# Patient Record
Sex: Male | Born: 1975 | Hispanic: No | Marital: Single | Smoking: Current every day smoker
Health system: Southern US, Community
[De-identification: ages and names within clinical notes are randomized; demographics above are authoritative.]

## PROBLEM LIST (undated history)

## (undated) DIAGNOSIS — I10 Essential (primary) hypertension: Secondary | ICD-10-CM

## (undated) DIAGNOSIS — E669 Obesity, unspecified: Secondary | ICD-10-CM

## (undated) HISTORY — PX: HERNIA REPAIR: SHX51

## (undated) HISTORY — DX: Obesity, unspecified: E66.9

---

## 2010-01-13 ENCOUNTER — Encounter (INDEPENDENT_AMBULATORY_CARE_PROVIDER_SITE_OTHER): Payer: Self-pay | Admitting: *Deleted

## 2010-03-29 NOTE — Letter (Signed)
Summary: [place] No Show Letter  [place] [place]  [address] [place], [place] [address]   Phone: [phone]  Fax: [phone]    01/13/2010 MRN: [id number]  [facility] [address] [place], [place]  [address]   Dear Mr. Sheckler,   Our records indicate that you missed your scheduled appointment with _____Dr.Tabori___________ on _____11/17/11_____.  Please contact this office to reschedule your appointment as soon as possible.  It is important that you keep your scheduled appointments with your physician, so we can provide you the best care possible.  Please be advised that there may be a charge for "no show" appointments.    Sincerely,   [place] [facility]

## 2010-06-30 ENCOUNTER — Encounter: Payer: Self-pay | Admitting: Family Medicine

## 2010-06-30 ENCOUNTER — Encounter: Payer: Self-pay | Admitting: *Deleted

## 2010-06-30 ENCOUNTER — Ambulatory Visit (INDEPENDENT_AMBULATORY_CARE_PROVIDER_SITE_OTHER): Payer: 59 | Admitting: Family Medicine

## 2010-06-30 DIAGNOSIS — Z720 Tobacco use: Secondary | ICD-10-CM | POA: Insufficient documentation

## 2010-06-30 DIAGNOSIS — Z0000 Encounter for general adult medical examination without abnormal findings: Secondary | ICD-10-CM

## 2010-06-30 DIAGNOSIS — F17200 Nicotine dependence, unspecified, uncomplicated: Secondary | ICD-10-CM

## 2010-06-30 LAB — HEPATIC FUNCTION PANEL
AST: 23 U/L (ref 0–37)
Albumin: 4.2 g/dL (ref 3.5–5.2)
Alkaline Phosphatase: 47 U/L (ref 39–117)
Bilirubin, Direct: 0.2 mg/dL (ref 0.0–0.3)
Total Bilirubin: 1.1 mg/dL (ref 0.3–1.2)

## 2010-06-30 LAB — CBC WITH DIFFERENTIAL/PLATELET
Basophils Absolute: 0 10*3/uL (ref 0.0–0.1)
Eosinophils Absolute: 0.1 10*3/uL (ref 0.0–0.7)
HCT: 44.5 % (ref 39.0–52.0)
Lymphs Abs: 1.7 10*3/uL (ref 0.7–4.0)
MCHC: 34.6 g/dL (ref 30.0–36.0)
MCV: 90 fl (ref 78.0–100.0)
Monocytes Absolute: 0.4 10*3/uL (ref 0.1–1.0)
Neutrophils Relative %: 55.4 % (ref 43.0–77.0)
Platelets: 216 10*3/uL (ref 150.0–400.0)
RDW: 13.2 % (ref 11.5–14.6)
WBC: 4.9 10*3/uL (ref 4.5–10.5)

## 2010-06-30 LAB — LIPID PANEL
HDL: 45.2 mg/dL (ref >39.00)
Total CHOL/HDL Ratio: 3
Triglycerides: 62 mg/dL (ref 0.0–149.0)
VLDL: 12.4 mg/dL (ref 0.0–40.0)

## 2010-06-30 LAB — BASIC METABOLIC PANEL
Calcium: 9.2 mg/dL (ref 8.4–10.5)
GFR: 107.28 mL/min (ref >60.00)
Glucose, Bld: 88 mg/dL (ref 70–99)
Potassium: 4.1 mEq/L (ref 3.5–5.1)
Sodium: 141 mEq/L (ref 135–145)

## 2010-06-30 MED ORDER — VARENICLINE TARTRATE 0.5 MG X 11 & 1 MG X 42 PO MISC: ORAL | Status: DC

## 2010-06-30 NOTE — Patient Instructions (Signed)
Follow up in 1 month to check on smoking and Chantix Start Chantix 1 week prior to your quit date Keep up the good work on exercise and try and make healthy food choices Call with any questions or concerns You can quit!!!

## 2010-06-30 NOTE — Assessment & Plan Note (Signed)
Pt's PE WNL w/ exception of obesity.  BP mildly elevated but reports this is frequent at doctor's appts but BP then normal when checked at pharmacy.  Will follow.  Anticipatory guidance provided.  Check labs.

## 2010-06-30 NOTE — Progress Notes (Signed)
  Subjective:    Patient ID: Paul Young, male    DOB: 11-08-1975, 35 y.o.   MRN: [id number]  HPI Here today to establish care.  Desires CPE.  Tobacco use- has been smoking since age 55.  Ready to quit.  Smoking 1 ppd or less.  No personal hx of mental illness- depression/anxiety.   Review of Systems Patient reports no vision/hearing changes, anorexia, fever ,adenopathy, persistant/recurrent hoarseness, swallowing issues, chest pain, palpitations, edema, persistant/recurrent cough, hemoptysis, dyspnea (rest,exertional, paroxysmal nocturnal), gastrointestinal  bleeding (melena, rectal bleeding), abdominal pain, excessive heart burn, GU symptoms (dysuria, hematuria, voiding/incontinence issues) syncope, focal weakness, memory loss, numbness & tingling, skin/hair/nail changes, depression, anxiety, abnormal bruising/bleeding, musculoskeletal symptoms/signs.    Objective:   Physical Exam BP 140/90  Temp(Src) 98.8 F (37.1 C) (Oral)  Ht 6\' 1"  (1.854 m)  Wt 262 lb 8 oz (119.069 kg)  BMI 34.63 kg/m2  General Appearance:    Alert, cooperative, no distress, appears stated age  Head:    Normocephalic, without obvious abnormality, atraumatic  Eyes:    PERRL, conjunctiva/corneas clear, EOM's intact, fundi    benign, both eyes       Ears:    Normal TM's and external ear canals, both ears  Nose:   Nares normal, septum midline, mucosa normal, no drainage   or sinus tenderness  Throat:   Lips, mucosa, and tongue normal; teeth and gums normal  Neck:   Supple, symmetrical, trachea midline, no adenopathy;       thyroid:  No enlargement/tenderness/nodules  Back:     Symmetric, no curvature, ROM normal, no CVA tenderness  Lungs:     Clear to auscultation bilaterally, respirations unlabored  Chest wall:    No tenderness or deformity  Heart:    Regular rate and rhythm, S1 and S2 normal, no murmur, rub   or gallop  Abdomen:     Soft, non-tender, bowel sounds active all four quadrants,    no masses,  no organomegaly  Genitalia:    Normal male without lesion, mass, discharge or tenderness  Rectal:    Deferred  Extremities:   Extremities normal, atraumatic, no cyanosis or edema  Pulses:   2+ and symmetric all extremities  Skin:   Skin color, texture, turgor normal, no rashes or lesions  Lymph nodes:   Cervical, supraclavicular, and axillary nodes normal  Neurologic:   CNII-XII intact. Normal strength, sensation and reflexes      throughout          Assessment & Plan:

## 2010-06-30 NOTE — Assessment & Plan Note (Signed)
Pt committed to quitting.  Start Chantix.  Follow closely.

## 2010-11-11 ENCOUNTER — Emergency Department (HOSPITAL_BASED_OUTPATIENT_CLINIC_OR_DEPARTMENT_OTHER)
Admission: EM | Admit: 2010-11-11 | Discharge: 2010-11-11 | Discharge status: Against Medical Advice | Payer: 59 | Attending: Emergency Medicine | Admitting: Emergency Medicine

## 2010-11-11 ENCOUNTER — Encounter (HOSPITAL_BASED_OUTPATIENT_CLINIC_OR_DEPARTMENT_OTHER): Payer: Self-pay | Admitting: *Deleted

## 2010-11-11 DIAGNOSIS — M549 Dorsalgia, unspecified: Secondary | ICD-10-CM | POA: Insufficient documentation

## 2010-11-11 NOTE — ED Notes (Signed)
Pt states he will not be able to wait and see the doctor due to fact it is a long wait and he has to work tomorrow.

## 2010-11-11 NOTE — ED Notes (Signed)
Lower back pain. No known injury. Pain radiates into his left leg.

## 2011-01-26 ENCOUNTER — Encounter: Payer: Self-pay | Admitting: Family Medicine

## 2011-01-26 ENCOUNTER — Ambulatory Visit (INDEPENDENT_AMBULATORY_CARE_PROVIDER_SITE_OTHER): Payer: 59 | Admitting: Family Medicine

## 2011-01-26 DIAGNOSIS — D239 Other benign neoplasm of skin, unspecified: Secondary | ICD-10-CM

## 2011-01-26 DIAGNOSIS — N529 Male erectile dysfunction, unspecified: Secondary | ICD-10-CM

## 2011-01-26 DIAGNOSIS — Z20828 Contact with and (suspected) exposure to other viral communicable diseases: Secondary | ICD-10-CM

## 2011-01-26 DIAGNOSIS — F17200 Nicotine dependence, unspecified, uncomplicated: Secondary | ICD-10-CM

## 2011-01-26 DIAGNOSIS — Z720 Tobacco use: Secondary | ICD-10-CM

## 2011-01-26 MED ORDER — NICOTINE 10 MG IN INHA: 1.0000 | RESPIRATORY_TRACT | Status: AC | PRN

## 2011-01-26 NOTE — Patient Instructions (Signed)
Follow up in 1-2 months to follow up smoking cessation We'll notify you of your lab results Use the nicotine inhaler to quit smoking- no more than 10 puffs daily Break the Cialis in half and take 30 minutes prior to activity (it lasts 36 hrs) Call with any questions or concerns Hang in there! Happy Holidays!

## 2011-01-26 NOTE — Progress Notes (Signed)
  Subjective:    Patient ID: Paul Young, male    DOB: March 14, 1975, 35 y.o.   MRN: [id number]  HPI Bump- on L lower leg, been there for possibly years.  No pain, no drainage but won't go away.  Possible exposure to STDs- wife had affair, wants checked.  Tobacco use- reports Chantix did 'nothing'.  Wants to try nicotine inhaler  ED- no problem getting erection, problem maintaining erections.  sxs started 1 yr ago.  Denies increased fatigue.   Review of Systems For ROS see HPI     Objective:   Physical Exam  Constitutional: He appears well-developed and well-nourished. No distress.  Neck: Normal range of motion. Neck supple. No thyromegaly present.  Cardiovascular: Normal rate, regular rhythm, normal heart sounds and intact distal pulses.   No murmur heard. Pulmonary/Chest: Effort normal and breath sounds normal. No respiratory distress. He has no wheezes. He has no rales.  Skin: Skin is warm and dry.       ~1cm dermatofibroma on L lower leg  Psychiatric: He has a normal mood and affect. His behavior is normal. Judgment and thought content normal.          Assessment & Plan:

## 2011-01-27 LAB — GC/CHLAMYDIA PROBE AMP, URINE: GC Probe Amp, Urine: NEGATIVE

## 2011-01-27 LAB — TESTOSTERONE, FREE, TOTAL, SHBG
Testosterone, Free: 180.6 pg/mL (ref 47.0–244.0)
Testosterone-% Free: 2 % (ref 1.6–2.9)

## 2011-01-27 LAB — HIV ANTIBODY (ROUTINE TESTING W REFLEX): HIV: NONREACTIVE

## 2011-02-05 NOTE — Assessment & Plan Note (Signed)
Due to wife's recent affair will get STD tests.

## 2011-02-05 NOTE — Assessment & Plan Note (Addendum)
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  Reassurance provided.  Pt would like this removed eventually but 'not right now'.  Will continue to follow.

## 2011-02-05 NOTE — Assessment & Plan Note (Signed)
Since chantix was unsuccessful will start nicotrol inhaler.  Instructions on use provided.  Will follow.

## 2011-02-05 NOTE — Assessment & Plan Note (Signed)
Discussed that this may be psychological w/ his recent family stressors.  Will start cialis and see if this improves his current sxs.  Check testosterone to assess for low t.  Pt expressed understanding and is in agreement w/ plan.

## 2011-04-24 ENCOUNTER — Telehealth: Payer: Self-pay | Admitting: Family Medicine

## 2011-04-24 NOTE — Telephone Encounter (Signed)
Patient was seen in November and given samples of Cialis. Patient would like a prescription called into the pharmacy. [facility] [address].

## 2011-04-25 MED ORDER — TADALAFIL 20 MG PO TABS: ORAL_TABLET | ORAL | Status: DC

## 2011-04-25 NOTE — Telephone Encounter (Signed)
Pt had 20mg  dose for PRN use- he was to break it in half.  #10, 3 refills

## 2011-04-25 NOTE — Telephone Encounter (Signed)
Last OV 01-26-11 not sure of correct dosage for pt per not in chart on med list please advise

## 2011-04-25 NOTE — Telephone Encounter (Signed)
rx sent to pharmacy by e-script  

## 2011-06-22 ENCOUNTER — Encounter: Payer: Self-pay | Admitting: Family Medicine

## 2011-06-22 ENCOUNTER — Ambulatory Visit (INDEPENDENT_AMBULATORY_CARE_PROVIDER_SITE_OTHER): Payer: 59 | Admitting: Family Medicine

## 2011-06-22 VITALS — BP 130/82 | HR 74 | Temp 97.8°F | Ht 69.75 in | Wt 235.4 lb

## 2011-06-22 DIAGNOSIS — F329 Major depressive disorder, single episode, unspecified: Secondary | ICD-10-CM | POA: Insufficient documentation

## 2011-06-22 DIAGNOSIS — F341 Dysthymic disorder: Secondary | ICD-10-CM

## 2011-06-22 DIAGNOSIS — L738 Other specified follicular disorders: Secondary | ICD-10-CM

## 2011-06-22 DIAGNOSIS — B350 Tinea barbae and tinea capitis: Secondary | ICD-10-CM

## 2011-06-22 DIAGNOSIS — F32A Depression, unspecified: Secondary | ICD-10-CM | POA: Insufficient documentation

## 2011-06-22 DIAGNOSIS — E669 Obesity, unspecified: Secondary | ICD-10-CM

## 2011-06-22 DIAGNOSIS — F419 Anxiety disorder, unspecified: Secondary | ICD-10-CM

## 2011-06-22 MED ORDER — TRIAMCINOLONE ACETONIDE 0.1 % EX OINT: TOPICAL_OINTMENT | Freq: Two times a day (BID) | CUTANEOUS | Status: DC

## 2011-06-22 MED ORDER — ESCITALOPRAM OXALATE 10 MG PO TABS: 10.0000 mg | ORAL_TABLET | Freq: Every day | ORAL | Status: DC

## 2011-06-22 NOTE — Assessment & Plan Note (Signed)
New.  Pt continues to lose weight.  Now exercising regularly and eating better.  Applauded his efforts.  Will continue to follow.

## 2011-06-22 NOTE — Assessment & Plan Note (Signed)
New.  Pt is going through difficulty divorce, financial issues and work stress.  Trouble w/ sleep.  No anger issues or SI/HI.  Start Lexapro daily.  Will follow closely.

## 2011-06-22 NOTE — Patient Instructions (Signed)
Follow up in 1 month to recheck mood Start the Lexapro daily Apply the triamcinolone twice daily as needed for the rash Don't shave over the area that's inflamed, this will make it worse Call with any questions or concerns Hang in there!!!

## 2011-06-22 NOTE — Assessment & Plan Note (Signed)
New.  Pt's rash consistent w/ shaving.  Start Triamcinolone cream prn for itching and redness.  Reviewed supportive care and red flags that should prompt return.  Pt expressed understanding and is in agreement w/ plan.
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
## 2011-07-20 ENCOUNTER — Ambulatory Visit: Payer: 59 | Admitting: Family Medicine

## 2011-07-20 DIAGNOSIS — Z0289 Encounter for other administrative examinations: Secondary | ICD-10-CM

## 2011-08-23 ENCOUNTER — Emergency Department (HOSPITAL_COMMUNITY)
Admission: EM | Admit: 2011-08-23 | Discharge: 2011-08-23 | Disposition: A | Discharge status: Home / Self Care | Payer: 59 | Attending: Emergency Medicine | Admitting: Emergency Medicine

## 2011-08-23 ENCOUNTER — Emergency Department (HOSPITAL_COMMUNITY): Payer: 59

## 2011-08-23 ENCOUNTER — Encounter (HOSPITAL_COMMUNITY): Payer: Self-pay | Admitting: Family Medicine

## 2011-08-23 DIAGNOSIS — R5381 Other malaise: Secondary | ICD-10-CM | POA: Insufficient documentation

## 2011-08-23 DIAGNOSIS — R5383 Other fatigue: Secondary | ICD-10-CM | POA: Insufficient documentation

## 2011-08-23 DIAGNOSIS — R531 Weakness: Secondary | ICD-10-CM

## 2011-08-23 DIAGNOSIS — R030 Elevated blood-pressure reading, without diagnosis of hypertension: Secondary | ICD-10-CM | POA: Insufficient documentation

## 2011-08-23 DIAGNOSIS — F17200 Nicotine dependence, unspecified, uncomplicated: Secondary | ICD-10-CM | POA: Insufficient documentation

## 2011-08-23 LAB — CBC WITH DIFFERENTIAL/PLATELET
Basophils Absolute: 0 10*3/uL (ref 0.0–0.1)
Basophils Relative: 0 % (ref 0–1)
Eosinophils Absolute: 0 10*3/uL (ref 0.0–0.7)
Eosinophils Relative: 1 % (ref 0–5)
HCT: 45.1 % (ref 39.0–52.0)
MCH: 31.7 pg (ref 26.0–34.0)
MCHC: 35.5 g/dL (ref 30.0–36.0)
MCV: 89.5 fL (ref 78.0–100.0)
Monocytes Absolute: 0.5 10*3/uL (ref 0.1–1.0)
RDW: 12.9 % (ref 11.5–15.5)

## 2011-08-23 LAB — BASIC METABOLIC PANEL
CO2: 26 mEq/L (ref 19–32)
Calcium: 9.8 mg/dL (ref 8.4–10.5)
Creatinine, Ser: 0.88 mg/dL (ref 0.50–1.35)
GFR calc Af Amer: >90 mL/min (ref >90)
GFR calc non Af Amer: >90 mL/min (ref >90)

## 2011-08-23 LAB — GLUCOSE, CAPILLARY: Glucose-Capillary: 143 mg/dL — ABNORMAL HIGH (ref 70–99)

## 2011-08-23 MED ORDER — ASPIRIN 81 MG PO CHEW: 324.0000 mg | CHEWABLE_TABLET | Freq: Once | ORAL | Status: DC

## 2011-08-23 MED ORDER — LISINOPRIL 20 MG PO TABS: 10.0000 mg | ORAL_TABLET | Freq: Every day | ORAL | Status: DC

## 2011-08-23 NOTE — Discharge Instructions (Signed)
Fatigue Fatigue is a feeling of tiredness, lack of energy, lack of motivation, or feeling tired all the time. Having enough rest, good nutrition, and reducing stress will normally reduce fatigue. Consult your caregiver if it persists. The nature of your fatigue will help your caregiver to find out its cause. The treatment is based on the cause.  CAUSES  There are many causes for fatigue. Most of the time, fatigue can be traced to one or more of your habits or routines. Most causes fit into one or more of three general areas. They are: Lifestyle problems  Sleep disturbances.   Overwork.   Physical exertion.   Unhealthy habits.   Poor eating habits or eating disorders.   Alcohol and/or drug use .   Lack of proper nutrition (malnutrition).  Psychological problems  Stress and/or anxiety problems.   Depression.   Grief.   Boredom.  Medical Problems or Conditions  Anemia.   Pregnancy.   Thyroid gland problems.   Recovery from major surgery.   Continuous pain.   Emphysema or asthma that is not well controlled   Allergic conditions.   Diabetes.   Infections (such as mononucleosis).   Obesity.   Sleep disorders, such as sleep apnea.   Heart failure or other heart-related problems.   Cancer.   Kidney disease.   Liver disease.   Effects of certain medicines such as antihistamines, cough and cold remedies, prescription pain medicines, heart and blood pressure medicines, drugs used for treatment of cancer, and some antidepressants.  SYMPTOMS  The symptoms of fatigue include:   Lack of energy.   Lack of drive (motivation).   Drowsiness.   Feeling of indifference to the surroundings.  DIAGNOSIS  The details of how you feel help guide your caregiver in finding out what is causing the fatigue. You will be asked about your present and past health condition. It is important to review all medicines that you take, including prescription and non-prescription items. A  thorough exam will be done. You will be questioned about your feelings, habits, and normal lifestyle. Your caregiver may suggest blood tests, urine tests, or other tests to look for common medical causes of fatigue.  TREATMENT  Fatigue is treated by correcting the underlying cause. For example, if you have continuous pain or depression, treating these causes will improve how you feel. Similarly, adjusting the dose of certain medicines will help in reducing fatigue.  HOME CARE INSTRUCTIONS   Try to get the required amount of good sleep every night.   Eat a healthy and nutritious diet, and drink enough water throughout the day.   Practice ways of relaxing (including yoga or meditation).   Exercise regularly.   Make plans to change situations that cause stress. Act on those plans so that stresses decrease over time. Keep your work and personal routine reasonable.   Avoid street drugs and minimize use of alcohol.   Start taking a daily multivitamin after consulting your caregiver.  SEEK MEDICAL CARE IF:   You have persistent tiredness, which cannot be accounted for.   You have fever.   You have unintentional weight loss.   You have headaches.   You have disturbed sleep throughout the night.   You are feeling sad.   You have constipation.   You have dry skin.   You have gained weight.   You are taking any new or different medicines that you suspect are causing fatigue.   You are unable to sleep at night.     You develop any unusual swelling of your legs or other parts of your body.  SEEK IMMEDIATE MEDICAL CARE IF:   You are feeling confused.   Your vision is blurred.   You feel faint or pass out.   You develop severe headache.   You develop severe abdominal, pelvic, or back pain.   You develop chest pain, shortness of breath, or an irregular or fast heartbeat.   You are unable to pass a normal amount of urine.   You develop abnormal bleeding such as bleeding from  the rectum or you vomit blood.   You have thoughts about harming yourself or committing suicide.   You are worried that you might harm someone else.  MAKE SURE YOU:   Understand these instructions.   Will watch your condition.   Will get help right away if you are not doing well or get worse.  Document Released: 12/11/2006 Document Revised: 02/02/2011 Document Reviewed: 12/11/2006 [facility] Patient Information 2012 [place], [place].Arterial Hypertension Arterial hypertension (high blood pressure) is a condition of elevated pressure in your blood vessels. Hypertension over a long period of time is a risk factor for strokes, heart attacks, and heart failure. It is also the leading cause of kidney (renal) failure.  CAUSES   In Adults -- Over 90% of all hypertension has no known cause. This is called essential or primary hypertension. In the other 10% of people with hypertension, the increase in blood pressure is caused by another disorder. This is called secondary hypertension. Important causes of secondary hypertension are:   Heavy alcohol use.   Obstructive sleep apnea.   Hyperaldosterosim (Conn's syndrome).   Steroid use.   Chronic kidney failure.   Hyperparathyroidism.   Medications.   Renal artery stenosis.   Pheochromocytoma.   Cushing's disease.   Coarctation of the aorta.   Scleroderma renal crisis.   Licorice (in excessive amounts).   Drugs (cocaine, methamphetamine).  Your caregiver can explain any items above that apply to you.  In Children -- Secondary hypertension is more common and should always be considered.   Pregnancy -- Few women of childbearing age have high blood pressure. However, up to 10% of them develop hypertension of pregnancy. Generally, this will not harm the woman. It may be a sign of 3 complications of pregnancy: preeclampsia, HELLP syndrome, and eclampsia. Follow up and control with medication is necessary.  SYMPTOMS   This condition  normally does not produce any noticeable symptoms. It is usually found during a routine exam.   Malignant hypertension is a late problem of high blood pressure. It may have the following symptoms:   Headaches.   Blurred vision.   End-organ damage (this means your kidneys, heart, lungs, and other organs are being damaged).   Stressful situations can increase the blood pressure. If a person with normal blood pressure has their blood pressure go up while being seen by their caregiver, this is often termed "white coat hypertension." Its importance is not known. It may be related with eventually developing hypertension or complications of hypertension.   Hypertension is often confused with mental tension, stress, and anxiety.  DIAGNOSIS  The diagnosis is made by 3 separate blood pressure measurements. They are taken at least 1 week apart from each other. If there is organ damage from hypertension, the diagnosis may be made without repeat measurements. Hypertension is usually identified by having blood pressure readings:  Above 140/90 mmHg measured in both arms, at 3 separate times, over a couple weeks.  Over 130/80 mmHg should be considered a risk factor and may require treatment in patients with diabetes.  Blood pressure readings over 120/80 mmHg are called "pre-hypertension" even in non-diabetic patients. To get a true blood pressure measurement, use the following guidelines. Be aware of the factors that can alter blood pressure readings.  Take measurements at least 1 hour after caffeine.   Take measurements 30 minutes after smoking and without any stress. This is another reason to quit smoking - it raises your blood pressure.   Use a proper cuff size. Ask your caregiver if you are not sure about your cuff size.   Most home blood pressure cuffs are automatic. They will measure systolic and diastolic pressures. The systolic pressure is the pressure reading at the start of sounds. Diastolic  pressure is the pressure at which the sounds disappear. If you are elderly, measure pressures in multiple postures. Try sitting, lying or standing.   Sit at rest for a minimum of 5 minutes before taking measurements.   You should not be on any medications like decongestants. These are found in many cold medications.   Record your blood pressure readings and review them with your caregiver.  If you have hypertension:  Your caregiver may do tests to be sure you do not have secondary hypertension (see "causes" above).   Your caregiver may also look for signs of metabolic syndrome. This is also called Syndrome X or Insulin Resistance Syndrome. You may have this syndrome if you have type 2 diabetes, abdominal obesity, and abnormal blood lipids in addition to hypertension.   Your caregiver will take your medical and family history and perform a physical exam.   Diagnostic tests may include blood tests (for glucose, cholesterol, potassium, and kidney function), a urinalysis, or an EKG. Other tests may also be necessary depending on your condition.  PREVENTION  There are important lifestyle issues that you can adopt to reduce your chance of developing hypertension:  Maintain a normal weight.   Limit the amount of salt (sodium) in your diet.   Exercise often.   Limit alcohol intake.   Get enough potassium in your diet. Discuss specific advice with your caregiver.   Follow a DASH diet (dietary approaches to stop hypertension). This diet is rich in fruits, vegetables, and low-fat dairy products, and avoids certain fats.  PROGNOSIS  Essential hypertension cannot be cured. Lifestyle changes and medical treatment can lower blood pressure and reduce complications. The prognosis of secondary hypertension depends on the underlying cause. Many people whose hypertension is controlled with medicine or lifestyle changes can live a normal, healthy life.  RISKS AND COMPLICATIONS  While high blood pressure  alone is not an illness, it often requires treatment due to its short- and long-term effects on many organs. Hypertension increases your risk for:  CVAs or strokes (cerebrovascular accident).   Heart failure due to chronically high blood pressure (hypertensive cardiomyopathy).   Heart attack (myocardial infarction).   Damage to the retina (hypertensive retinopathy).   Kidney failure (hypertensive nephropathy).  Your caregiver can explain list items above that apply to you. Treatment of hypertension can significantly reduce the risk of complications. TREATMENT   For overweight patients, weight loss and regular exercise are recommended. Physical fitness lowers blood pressure.   Mild hypertension is usually treated with diet and exercise. A diet rich in fruits and vegetables, fat-free dairy products, and foods low in fat and salt (sodium) can help lower blood pressure. Decreasing salt intake decreases blood pressure in  a 1/3 of people.   Stop smoking if you are a smoker.  The steps above are highly effective in reducing blood pressure. While these actions are easy to suggest, they are difficult to achieve. Most patients with moderate or severe hypertension end up requiring medications to bring their blood pressure down to a normal level. There are several classes of medications for treatment. Blood pressure pills (antihypertensives) will lower blood pressure by their different actions. Lowering the blood pressure by 10 mmHg may decrease the risk of complications by as much as 25%. The goal of treatment is effective blood pressure control. This will reduce your risk for complications. Your caregiver will help you determine the best treatment for you according to your lifestyle. What is excellent treatment for one person, may not be for you. HOME CARE INSTRUCTIONS   Do not smoke.   Follow the lifestyle changes outlined in the "Prevention" section.   If you are on medications, follow the  directions carefully. Blood pressure medications must be taken as prescribed. Skipping doses reduces their benefit. It also puts you at risk for problems.   Follow up with your caregiver, as directed.   If you are asked to monitor your blood pressure at home, follow the guidelines in the "Diagnosis" section above.  SEEK MEDICAL CARE IF:   You think you are having medication side effects.   You have recurrent headaches or lightheadedness.   You have swelling in your ankles.   You have trouble with your vision.  SEEK IMMEDIATE MEDICAL CARE IF:   You have sudden onset of chest pain or pressure, difficulty breathing, or other symptoms of a heart attack.   You have a severe headache.   You have symptoms of a stroke (such as sudden weakness, difficulty speaking, difficulty walking).  MAKE SURE YOU:   Understand these instructions.   Will watch your condition.   Will get help right away if you are not doing well or get worse.  Document Released: 02/13/2005 Document Revised: 02/02/2011 Document Reviewed: 09/13/2006 [facility] Patient Information 2012 [place], [place].

## 2011-08-23 NOTE — ED Provider Notes (Signed)
History     CSN: [id number]  Arrival date & time 08/23/11  1303   First MD Initiated Contact with Patient 08/23/11 1750      Chief Complaint  Patient presents with  . Chest Pain  . Dizziness    Patient is a 36 y.o. male presenting with weakness. The history is provided by the patient and the spouse.  Weakness The primary symptoms include headaches (frontal). Primary symptoms do not include syncope, loss of consciousness, altered mental status, seizures, dizziness, visual change, paresthesias, focal weakness, loss of sensation, fever, nausea or vomiting. The symptoms began 6 to 12 hours ago. The symptoms are improving. The neurological symptoms are diffuse. Context: patient has been under a lot of stress recently and has complained   The headache is associated with weakness (generalized). The headache is not associated with photophobia, visual change, neck stiffness or paresthesias.  Additional symptoms include weakness (generalized). Additional symptoms do not include neck stiffness or photophobia. Medical issues do not include seizures, cerebral vascular accident or drug use.    Past Medical History  Diagnosis Date  . Kidney stones     Past Surgical History  Procedure Date  . Hernia repair     age 13    Family History  Problem Relation Age of Onset  . Arthritis Father   . Heart disease Father   . Hypertension Father   . Stroke Father   . Diabetes Father   . Mental illness Daughter     History  Substance Use Topics  . Smoking status: Current Everyday Smoker  . Smokeless tobacco: Not on file  . Alcohol Use: Yes     rare      Review of Systems  Constitutional: Negative for fever, chills and diaphoresis.  HENT: Negative for neck pain and neck stiffness.   Eyes: Negative for photophobia.  Respiratory: Positive for chest tightness and shortness of breath. Negative for cough and wheezing.   Cardiovascular: Positive for chest pain (earlier tin the day). Negative for  palpitations, leg swelling and syncope.  Gastrointestinal: Negative for nausea, vomiting, abdominal pain, diarrhea and constipation.  Skin: Negative for rash and wound.  Neurological: Positive for weakness (generalized), light-headedness and headaches (frontal). Negative for dizziness, focal weakness, seizures, loss of consciousness, syncope, numbness and paresthesias.  Psychiatric/Behavioral: Negative for suicidal ideas, self-injury and altered mental status. The patient is nervous/anxious.   All other systems reviewed and are negative.    Allergies  Bee venom  Home Medications   Current Outpatient Rx  Name Route Sig Dispense Refill  . ESCITALOPRAM OXALATE 10 MG PO TABS Oral Take 10 mg by mouth daily.      BP 150/82  Pulse 82  Temp 97.7 F (36.5 C) (Oral)  SpO2 97%  Physical Exam  Nursing note and vitals reviewed. Constitutional: He appears well-developed and well-nourished.  HENT:  Head: Normocephalic and atraumatic.  Right Ear: External ear normal.  Left Ear: External ear normal.  Nose: Nose normal.  Mouth/Throat: Oropharynx is clear and moist. No oropharyngeal exudate.       No papilledema  Eyes: Conjunctivae are normal. Pupils are equal, round, and reactive to light.  Neck: Normal range of motion. Neck supple.  Cardiovascular: Normal rate, regular rhythm, normal heart sounds and intact distal pulses.   Pulmonary/Chest: Effort normal and breath sounds normal. No respiratory distress. He has no wheezes. He has no rales. He exhibits no tenderness.  Abdominal: Soft. Bowel sounds are normal. He exhibits no distension and no mass.  There is no tenderness. There is no rebound and no guarding.  Musculoskeletal: Normal range of motion. He exhibits no edema and no tenderness.  Neurological: He is alert. He displays normal reflexes. No cranial nerve deficit. He exhibits normal muscle tone. Coordination normal.  Skin: Skin is warm and dry. No rash noted. No erythema. No pallor.    Psychiatric: He has a normal mood and affect. His behavior is normal. Judgment and thought content normal.    ED Course  Procedures (including critical care time)  Labs Reviewed  BASIC METABOLIC PANEL - Abnormal; Notable for the following:    Glucose, Bld 139 (*)     All other components within normal limits  GLUCOSE, CAPILLARY - Abnormal; Notable for the following:    Glucose-Capillary 143 (*)     All other components within normal limits  CBC WITH DIFFERENTIAL  POCT I-STAT TROPONIN I   Dg Chest 2 View  08/23/2011  *RADIOLOGY REPORT*  Clinical Data: Chest pain and dizziness  CHEST - 2 VIEW  Comparison: None.  Findings: Normal heart size.  Clear lungs.  No pleural effusion. No pneumothorax.  IMPRESSION: No active cardiopulmonary disease.  Original Report Authenticated By: Donavan Burnet, M.D.     1. Weakness generalized   2. Elevated blood pressure      Date: 08/23/2011  Rate: 75 bpm  Rhythm: normal sinus rhythm  QRS Axis: normal  Intervals: normal  ST/T Wave abnormalities: normal  Conduction Disutrbances:none  Narrative Interpretation: No evidence of acute ischemia or arrythmia  Old EKG Reviewed: none available  MDM  36 yo M presents for a headache, gradual-in-onset, and weakness the last several days since starting Lexapro. Patient has had recurrent elevated blood pressure readings by his spouse and at his PCP's office but has not started any anti-hypertensives because he says he would not be able to follow-up with PCP but would like to start an anti-hypertensive. No neuro si/sx. No focal neuro deficits. Clinical picture not concerning for [facility] or CVA. Suspect weakness secondary to dehydration as patient seems to be more symptomatic when working on warmer, more humid days such as today. Will avoid diuretics, therefore, and start patient on low-dose Lisinopril and have him follow-up with his primary care physician regarding continued elevated blood pressure readings. Patient  given return precautions, including worsening of signs or symptoms.        Clemetine Marker, MD 08/23/11 2320

## 2011-08-23 NOTE — ED Provider Notes (Signed)
I saw and evaluated the patient, reviewed the resident's note and I agree with the findings and plan.   Quinto Tippy, MD 08/23/11 2356 

## 2011-08-23 NOTE — ED Provider Notes (Signed)
36 year old male was working outside in the heat when he got weak and lightheaded and developed a headache. He feels much better now and it seems that he likely got dehydrated from being out in the S1. His laboratory workup was significant for mild hyperglycemia and he does state that he has been told that he is prediabetic. His blood pressure slightly elevated and he has been monitoring his blood pressure and has been persistently elevated in the 150-170 range. He'll be started on lisinopril today and he is to follow up with his family doctor next week. Some doctor has been monitoring his blood sugars and will continue to do so. He is urged to make sure he maintains adequate hydration as you're working outside in the heat and humidity.  Dione Booze, MD 08/23/11 1840

## 2011-08-23 NOTE — ED Notes (Signed)
Pt complaining of chest pain, dizziness and SOB since this morning. sts he has been working outside.

## 2011-08-23 NOTE — ED Notes (Signed)
CBG 143  

## 2011-08-24 ENCOUNTER — Ambulatory Visit (INDEPENDENT_AMBULATORY_CARE_PROVIDER_SITE_OTHER): Payer: 59 | Admitting: Family Medicine

## 2011-08-24 ENCOUNTER — Encounter: Payer: Self-pay | Admitting: Family Medicine

## 2011-08-24 VITALS — BP 140/88 | HR 95 | Temp 98.6°F | Ht 71.0 in | Wt 229.2 lb

## 2011-08-24 DIAGNOSIS — R739 Hyperglycemia, unspecified: Secondary | ICD-10-CM

## 2011-08-24 DIAGNOSIS — B353 Tinea pedis: Secondary | ICD-10-CM

## 2011-08-24 DIAGNOSIS — R7309 Other abnormal glucose: Secondary | ICD-10-CM

## 2011-08-24 DIAGNOSIS — I10 Essential (primary) hypertension: Secondary | ICD-10-CM

## 2011-08-24 MED ORDER — LISINOPRIL 10 MG PO TABS: 10.0000 mg | ORAL_TABLET | Freq: Every day | ORAL | Status: DC

## 2011-08-24 MED ORDER — GRISEOFULVIN MICROSIZE 500 MG PO TABS: 500.0000 mg | ORAL_TABLET | Freq: Every day | ORAL | Status: AC

## 2011-08-24 NOTE — Progress Notes (Signed)
  Subjective:    Patient ID: Paul Young, male    DOB: 01-Mar-1975, 36 y.o.   MRN: [id number]  HPI Elevated BP- went to ER yesterday due to CP, weakness, dizziness, HA.  Had been outside all day in the heat.  Thought sugar was low so took an early lunch but felt worse after eating.  Was started on Lisinopril 10mg  yesterday due to elevated BP.  Had normal EKG.  Was over 5 hrs before pt was seen.  Did not get fluids.  Was told that CBG was 'high' but pt had eaten <1 hr prior.  Still having slight HA but feeling much better than yesterday.  Pt reports home BPs have been running 150-170 every time its checked.  Quit smoking 2 weeks ago!  Foot fungus- using OTC anti-fungal creams w/out relief.  Reports he has a hx of similar and the only thing that clears problem is Lamisil pills.  Also has fungal dermatitis on neck and face from sweating/shaving.   Review of Systems For ROS see HPI     Objective:   Physical Exam  Vitals reviewed. Constitutional: He is oriented to person, place, and time. He appears well-developed and well-nourished. No distress.  HENT:  Head: Normocephalic and atraumatic.  Eyes: Conjunctivae and EOM are normal. Pupils are equal, round, and reactive to light.  Neck: Normal range of motion. Neck supple. No thyromegaly present.  Cardiovascular: Normal rate, regular rhythm, normal heart sounds and intact distal pulses.   No murmur heard. Pulmonary/Chest: Effort normal and breath sounds normal. No respiratory distress.  Abdominal: Soft. Bowel sounds are normal. He exhibits no distension.  Musculoskeletal: He exhibits no edema.  Lymphadenopathy:    He has no cervical adenopathy.  Neurological: He is alert and oriented to person, place, and time. No cranial nerve deficit.  Skin: Skin is warm and dry. Rash (fungal dermatitis on feet bilateral and tinea barbae on face and neck) noted.  Psychiatric: He has a normal mood and affect. His behavior is normal.            Assessment & Plan:

## 2011-08-24 NOTE — Patient Instructions (Addendum)
Schedule your complete physical in 4-6 weeks We'll notify you of your lab results and make any changes if needed Continue the Lisinopril- 1 tab of the new script Start the Godley daily for the fungal infection Increase your water intake! Congrats on quitting smoking! Call with any questions or concerns Hang in there!

## 2011-08-25 LAB — HEPATIC FUNCTION PANEL
ALT: 17 U/L (ref 0–53)
AST: 18 U/L (ref 0–37)
Total Bilirubin: 0.9 mg/dL (ref 0.3–1.2)

## 2011-08-27 NOTE — Assessment & Plan Note (Signed)
New- on labs done in ER. Suspect this was due to fact that he had just eaten but will get A1C as this was not done in ER and pt is concerned about this.  Reviewed importance of healthy diet, regular exercise.  Will follow.

## 2011-08-27 NOTE — Assessment & Plan Note (Signed)
New.  Given fungal infxn of feet, beard, and scalp will start oral Griseo x1 month.  Reviewed supportive care and red flags that should prompt return.  Pt expressed understanding and is in agreement w/ plan.

## 2011-08-27 NOTE — Assessment & Plan Note (Signed)
New.  Pt now officially hypertensive.  Agree w/ 10mg  lisinopril as given in ER but will provide 10mg  tabs rather than splitting the 20mg  pills.  Pt stopped smoking 2 weeks ago- applauded his efforts.  Will continue to follow closely.

## 2011-08-28 ENCOUNTER — Encounter: Payer: Self-pay | Admitting: *Deleted

## 2011-08-30 ENCOUNTER — Telehealth: Payer: Self-pay | Admitting: *Deleted

## 2011-08-30 NOTE — Telephone Encounter (Signed)
Can double to 2 tabs daily (20 mg) or take a whole tab of what the ER initially gave- either way, 20 mg daily

## 2011-08-30 NOTE — Telephone Encounter (Signed)
Spoke to pt to advise results/instructions. Pt understood. Pt will start taking 2 tabs daily, advised if he starts to experience any chest pains or worsening sxs to please go to ER/UC per noted our office will be closed tomorrow however we do have [facility] to answer any questions he may have, pt understood and will call office back if this does not resolve

## 2011-08-30 NOTE — Telephone Encounter (Signed)
Pt indicated that after a week on new BP med blood pressure is still running 140-165, 80-98. BP [id number] Pt notes that he is taking his BP multiple time a day at different time. Pt denies all other symptoms except for a headache..Please advise

## 2011-09-27 ENCOUNTER — Telehealth: Payer: Self-pay | Admitting: Family Medicine

## 2011-09-27 NOTE — Telephone Encounter (Signed)
Can be seen at 8 am tomorrow instead of UC if he wants

## 2011-09-27 NOTE — Telephone Encounter (Signed)
Contacted scheduler to call pt to offer the 8am opening, scheduler Greggory Brandy whom advised the pt was offered this apt prior to transfer to [facility] and pt stated he can not come in at 8am tomorrow per has to work, advised MD tabori verbally.

## 2011-09-27 NOTE — Telephone Encounter (Signed)
Caller: Paul Young/Patient ; PCP: Sheliah Hatch.; CB#: [phone];  Call regarding Cough/Congestion; Seen on 09/17/11 at [facility] dx w/ bronchitis and took Z-Pack and took OTC with DM. "Basically did nothing for me.".  Afebrile/subjective.  Cough producing green sputum.  Green nasal secretions.  See in 4 hours per Breathing Problems protocol; no appointments available at office; per standing orders advised to go to [facility] UC.  Home care for the interim and parameters for callback given.

## 2011-09-28 NOTE — Telephone Encounter (Signed)
Called pt to update per did not see pt visit to [facility] UC, did note pt upcoming OV with MD Tabori on 10-04-11, left vm to see if his sxs have improved and how he is feeling, advised if he has any further needs to please call our office

## 2011-10-03 NOTE — Telephone Encounter (Signed)
No return call noted from pt/nor notation of pt attending [facility] UC facility, will be treated by MD Tabori during OV noted tomorrow 10-04-11

## 2011-10-04 ENCOUNTER — Encounter: Payer: Self-pay | Admitting: Family Medicine

## 2011-10-04 ENCOUNTER — Ambulatory Visit (INDEPENDENT_AMBULATORY_CARE_PROVIDER_SITE_OTHER): Payer: 59 | Admitting: Family Medicine

## 2011-10-04 VITALS — BP 122/86 | HR 68 | Temp 98.4°F | Ht 71.0 in | Wt 236.8 lb

## 2011-10-04 DIAGNOSIS — F419 Anxiety disorder, unspecified: Secondary | ICD-10-CM

## 2011-10-04 DIAGNOSIS — F341 Dysthymic disorder: Secondary | ICD-10-CM

## 2011-10-04 DIAGNOSIS — Z0000 Encounter for general adult medical examination without abnormal findings: Secondary | ICD-10-CM

## 2011-10-04 DIAGNOSIS — I10 Essential (primary) hypertension: Secondary | ICD-10-CM

## 2011-10-04 LAB — LIPID PANEL
Cholesterol: 142 mg/dL (ref 0–200)
LDL Cholesterol: 69 mg/dL (ref 0–99)

## 2011-10-04 MED ORDER — ESCITALOPRAM OXALATE 20 MG PO TABS: 20.0000 mg | ORAL_TABLET | Freq: Every day | ORAL | Status: DC

## 2011-10-04 MED ORDER — LISINOPRIL 20 MG PO TABS: 20.0000 mg | ORAL_TABLET | Freq: Every day | ORAL | Status: DC

## 2011-10-04 NOTE — Progress Notes (Signed)
  Subjective:    Patient ID: Paul Young, male    DOB: 1975/03/18, 36 y.o.   MRN: [id number]  HPI CPE- taking 20mg  Lisinopril daily, would like to increase Lexapro to 20mg  daily.   Review of Systems Patient reports no vision/hearing changes, anorexia, fever ,adenopathy, persistant/recurrent hoarseness, swallowing issues, chest pain, palpitations, edema, persistant/recurrent cough, hemoptysis, dyspnea (rest,exertional, paroxysmal nocturnal), gastrointestinal  bleeding (melena, rectal bleeding), abdominal pain, excessive heart burn, GU symptoms (dysuria, hematuria, voiding/incontinence issues) syncope, focal weakness, memory loss, numbness & tingling, skin/hair/nail changes, abnormal bruising/bleeding, musculoskeletal symptoms/signs.     Objective:   Physical Exam BP 122/86  Pulse 68  Temp 98.4 F (36.9 C) (Oral)  Ht 5\' 11"  (1.803 m)  Wt 236 lb 12.8 oz (107.412 kg)  BMI 33.03 kg/m2  SpO2 98%  General Appearance:    Alert, cooperative, no distress, appears stated age  Head:    Normocephalic, without obvious abnormality, atraumatic  Eyes:    PERRL, conjunctiva/corneas clear, EOM's intact, fundi    benign, both eyes       Ears:    Normal TM's and external ear canals, both ears  Nose:   Nares normal, septum midline, mucosa normal, no drainage   or sinus tenderness  Throat:   Lips, mucosa, and tongue normal; teeth and gums normal  Neck:   Supple, symmetrical, trachea midline, no adenopathy;       thyroid:  No enlargement/tenderness/nodules  Back:     Symmetric, no curvature, ROM normal, no CVA tenderness  Lungs:     Clear to auscultation bilaterally, respirations unlabored  Chest wall:    No tenderness or deformity  Heart:    Regular rate and rhythm, S1 and S2 normal, no murmur, rub   or gallop  Abdomen:     Soft, non-tender, bowel sounds active all four quadrants,    no masses, no organomegaly  Genitalia:    Normal male without lesion, discharge or tenderness  Rectal:     Deferred due to young age  Extremities:   Extremities normal, atraumatic, no cyanosis or edema  Pulses:   2+ and symmetric all extremities  Skin:   Skin color, texture, turgor normal, no rashes or lesions  Lymph nodes:   Cervical, supraclavicular, and axillary nodes normal  Neurologic:   CNII-XII intact. Normal strength, sensation and reflexes      throughout          Assessment & Plan:

## 2011-10-04 NOTE — Assessment & Plan Note (Signed)
Pt's PE WNL.  All labs recently checked w/ exception of lipids- check today.  Anticipatory guidance provided.

## 2011-10-04 NOTE — Patient Instructions (Addendum)
Follow up in 6 months to recheck BP- sooner if needed We'll notify you of your cholesterol results Keep up the good work! Continue 20mg  of Lisinopril (1 tab daily- new script sent) Increase to 20mg  of Lexapro- 2 of what you currently have, 1 of the new script Call with any questions or concerns Enjoy the rest of your summer!!!

## 2011-10-04 NOTE — Telephone Encounter (Signed)
Pt seen in office today for CPE and noted feeling much better now per recent call about bronchitis symptoms. MD Beverely Low aware

## 2011-10-04 NOTE — Assessment & Plan Note (Signed)
Pt would like to increase Lexapro to 20mg  daily.  Script provided

## 2011-10-04 NOTE — Assessment & Plan Note (Signed)
Continue 20mg  of Lisinopril- BP well controlled today.

## 2011-10-05 ENCOUNTER — Encounter: Payer: Self-pay | Admitting: *Deleted

## 2011-10-13 DIAGNOSIS — Z0279 Encounter for issue of other medical certificate: Secondary | ICD-10-CM

## 2011-11-22 ENCOUNTER — Telehealth: Payer: Self-pay | Admitting: Family Medicine

## 2011-11-22 MED ORDER — LISINOPRIL 20 MG PO TABS: 20.0000 mg | ORAL_TABLET | Freq: Every day | ORAL | Status: DC

## 2011-11-22 MED ORDER — ESCITALOPRAM OXALATE 20 MG PO TABS: 20.0000 mg | ORAL_TABLET | Freq: Every day | ORAL | Status: DC

## 2011-11-22 NOTE — Telephone Encounter (Signed)
Refill: Escitalopram 20mg  tablet. 90 day supply. [id number]  Refill: Lisinopril 20mg  tablet. 90 day supply

## 2011-11-22 NOTE — Telephone Encounter (Signed)
Rx sent.    MW 

## 2011-12-06 ENCOUNTER — Telehealth: Payer: Self-pay

## 2011-12-06 MED ORDER — ESCITALOPRAM OXALATE 20 MG PO TABS: 20.0000 mg | ORAL_TABLET | Freq: Every day | ORAL | Status: DC

## 2011-12-06 MED ORDER — LISINOPRIL 20 MG PO TABS: 20.0000 mg | ORAL_TABLET | Freq: Every day | ORAL | Status: DC

## 2011-12-06 NOTE — Telephone Encounter (Signed)
Rx sent to pharmacy pt aware.      MW

## 2012-01-03 ENCOUNTER — Ambulatory Visit (INDEPENDENT_AMBULATORY_CARE_PROVIDER_SITE_OTHER): Payer: 59 | Admitting: Family Medicine

## 2012-01-03 ENCOUNTER — Encounter: Payer: Self-pay | Admitting: Family Medicine

## 2012-01-03 VITALS — BP 124/80 | HR 62 | Temp 98.0°F | Resp 17 | Wt 253.4 lb

## 2012-01-03 DIAGNOSIS — IMO0002 Reserved for concepts with insufficient information to code with codable children: Secondary | ICD-10-CM

## 2012-01-03 DIAGNOSIS — M5416 Radiculopathy, lumbar region: Secondary | ICD-10-CM | POA: Insufficient documentation

## 2012-01-03 MED ORDER — LISINOPRIL 20 MG PO TABS: 20.0000 mg | ORAL_TABLET | Freq: Every day | ORAL | Status: DC

## 2012-01-03 MED ORDER — HYDROCODONE-ACETAMINOPHEN 5-500 MG PO TABS: 1.0000 | ORAL_TABLET | Freq: Three times a day (TID) | ORAL | Status: DC | PRN

## 2012-01-03 MED ORDER — CYCLOBENZAPRINE HCL 10 MG PO TABS: 10.0000 mg | ORAL_TABLET | Freq: Three times a day (TID) | ORAL | Status: DC | PRN

## 2012-01-03 MED ORDER — ESCITALOPRAM OXALATE 20 MG PO TABS: 20.0000 mg | ORAL_TABLET | Freq: Every day | ORAL | Status: AC

## 2012-01-03 MED ORDER — PREDNISONE 10 MG PO TABS: ORAL_TABLET | ORAL | Status: DC

## 2012-01-03 NOTE — Progress Notes (Signed)
  Subjective:    Patient ID: Paul Young, male    DOB: 1975-07-31, 36 y.o.   MRN: [id number]  HPI Back pain- hx of pinched nerve, dx'd 6 months ago at [facility].  Was started on meds and after 1 week sxs resolved.  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  No weakness or numbness of leg, no bowel or bladder incontinence.  No fevers.  'hurts like hell' when standing.  Pain w/ bending or movement.   Review of Systems For ROS see HPI     Objective:   Physical Exam  Vitals reviewed. Constitutional: He is oriented to person, place, and time. He appears well-developed and well-nourished.       Obviously uncomfortable  Musculoskeletal:       (-) SLR bilaterally + TTP over R lumbar and paraspinals Pain w/ extension > flexion  Neurological: He is alert and oriented to person, place, and time. He has normal reflexes.          Assessment & Plan:

## 2012-01-03 NOTE — Assessment & Plan Note (Signed)
New.  Start pred taper, flexeril, vicodin prn for severe pain.  Heat/ice.  If sxs become more frequent or severe will need ortho referral.  Pt expressed understanding and is in agreement w/ plan.

## 2012-01-03 NOTE — Patient Instructions (Addendum)
Start the Prednisone as directed- take w/ food Use the Flexeril as needed for muscle spasm- this may make you drowsy Use the vicodin for severe pain- again may make you drowsy Heating pad as needed Hang in there!!!

## 2012-01-08 ENCOUNTER — Telehealth: Payer: Self-pay | Admitting: Family Medicine

## 2012-01-08 NOTE — Telephone Encounter (Signed)
Discuss with patient  

## 2012-01-08 NOTE — Telephone Encounter (Signed)
LM ON TRIAGE LINE 131pm is now having rt leg pain was in last week for back pain, wants to know what he needs to do now cb# [phone]

## 2012-01-08 NOTE — Telephone Encounter (Signed)
Should continue w/ the prednisone and muscle relaxers.  Heating pad as needed.  If no improvement at the end of the prednisone, should let me know and we'll refer to ortho.

## 2012-03-26 ENCOUNTER — Other Ambulatory Visit: Payer: Self-pay | Admitting: Family Medicine

## 2012-03-26 MED ORDER — LISINOPRIL 20 MG PO TABS: 20.0000 mg | ORAL_TABLET | Freq: Every day | ORAL | Status: DC

## 2012-03-26 NOTE — Telephone Encounter (Signed)
Rx sent to the pharmacy by e-script.//AB/CMA 

## 2012-03-26 NOTE — Telephone Encounter (Signed)
refil Lisinopril (Tab) 20 MG Take 1 tablet (20 mg total) by mouth daily. #90 last fill 10.09.13

## 2012-04-03 ENCOUNTER — Encounter (HOSPITAL_BASED_OUTPATIENT_CLINIC_OR_DEPARTMENT_OTHER): Payer: Self-pay

## 2012-04-03 ENCOUNTER — Emergency Department (HOSPITAL_BASED_OUTPATIENT_CLINIC_OR_DEPARTMENT_OTHER)
Admission: EM | Admit: 2012-04-03 | Discharge: 2012-04-03 | Disposition: A | Discharge status: Home / Self Care | Payer: BC Managed Care – PPO | Attending: Emergency Medicine | Admitting: Emergency Medicine

## 2012-04-03 ENCOUNTER — Emergency Department (HOSPITAL_BASED_OUTPATIENT_CLINIC_OR_DEPARTMENT_OTHER): Payer: BC Managed Care – PPO

## 2012-04-03 DIAGNOSIS — F17200 Nicotine dependence, unspecified, uncomplicated: Secondary | ICD-10-CM | POA: Insufficient documentation

## 2012-04-03 DIAGNOSIS — S60011A Contusion of right thumb without damage to nail, initial encounter: Secondary | ICD-10-CM

## 2012-04-03 DIAGNOSIS — Z87442 Personal history of urinary calculi: Secondary | ICD-10-CM | POA: Insufficient documentation

## 2012-04-03 DIAGNOSIS — Y929 Unspecified place or not applicable: Secondary | ICD-10-CM | POA: Insufficient documentation

## 2012-04-03 DIAGNOSIS — S6000XA Contusion of unspecified finger without damage to nail, initial encounter: Secondary | ICD-10-CM | POA: Insufficient documentation

## 2012-04-03 DIAGNOSIS — I10 Essential (primary) hypertension: Secondary | ICD-10-CM | POA: Insufficient documentation

## 2012-04-03 DIAGNOSIS — IMO0002 Reserved for concepts with insufficient information to code with codable children: Secondary | ICD-10-CM | POA: Insufficient documentation

## 2012-04-03 DIAGNOSIS — Y939 Activity, unspecified: Secondary | ICD-10-CM | POA: Insufficient documentation

## 2012-04-03 DIAGNOSIS — Z79899 Other long term (current) drug therapy: Secondary | ICD-10-CM | POA: Insufficient documentation

## 2012-04-03 HISTORY — DX: Essential (primary) hypertension: I10

## 2012-04-03 NOTE — ED Notes (Signed)
Hit right thumb with hammer yesterday

## 2012-04-03 NOTE — ED Provider Notes (Signed)
History     CSN: [id number]  Arrival date & time 04/03/12  1407   First MD Initiated Contact with Patient 04/03/12 1455      Chief Complaint  Patient presents with  . Finger Injury    (Consider location/radiation/quality/duration/timing/severity/associated sxs/prior treatment) HPI Pt reports he hit his R thumb with a hammer yesterday. He put ice on it then which helped some but pain returned today. Moderate throbbing pain, worse with palpation. Denies any bleeding.   Past Medical History  Diagnosis Date  . Kidney stones   . Hypertension     Past Surgical History  Procedure Date  . Hernia repair     age 37    Family History  Problem Relation Age of Onset  . Arthritis Father   . Heart disease Father   . Hypertension Father   . Stroke Father   . Diabetes Father   . Mental illness Daughter     History  Substance Use Topics  . Smoking status: Current Every Day Smoker  . Smokeless tobacco: Not on file  . Alcohol Use: Yes      Review of Systems All other systems reviewed and are negative except as noted in HPI.   Allergies  Bee venom  Home Medications   Current Outpatient Rx  Name  Route  Sig  Dispense  Refill  . LEXAPRO PO   Oral   Take by mouth.         . CYCLOBENZAPRINE HCL 10 MG PO TABS   Oral   Take 1 tablet (10 mg total) by mouth 3 (three) times daily as needed for muscle spasms.   30 tablet   0   . GRISEOFULVIN MICROSIZE 500 MG PO TABS               . HYDROCODONE-ACETAMINOPHEN 5-500 MG PO TABS   Oral   Take 1 tablet by mouth every 8 (eight) hours as needed for pain.   20 tablet   0   . LISINOPRIL 20 MG PO TABS   Oral   Take 1 tablet (20 mg total) by mouth daily.   90 tablet   3   . PREDNISONE 10 MG PO TABS      3 tabs x3 days and then 2 tabs x3 days and then 1 tab x3 days.  Take w/ food.   18 tablet   0     BP 146/89  Pulse 72  Temp 97.9 F (36.6 C) (Oral)  Resp 16  Ht 6\' 1"  (1.854 m)  Wt 255 lb (115.667 kg)  BMI  33.64 kg/m2  SpO2 100%  Physical Exam  Constitutional: He is oriented to person, place, and time. He appears well-developed and well-nourished.  HENT:  Head: Normocephalic and atraumatic.  Neck: Neck supple.  Pulmonary/Chest: Effort normal.  Musculoskeletal: He exhibits edema and tenderness (tender to palpation R distal thumb, small amount of bruising under thumbnail, but no significant subungual hematoma).  Neurological: He is alert and oriented to person, place, and time. No cranial nerve deficit.  Psychiatric: He has a normal mood and affect. His behavior is normal.    ED Course  Procedures (including critical care time)  Labs Reviewed - No data to display Dg Finger Thumb Right  04/03/2012  *RADIOLOGY REPORT*  Clinical Data: Pain and bruising.  Hit with a hammer.  RIGHT THUMB 2+V  Comparison: None.  Findings: Soft tissue swelling is present in the colon.  No acute osseous abnormality is evident.  IMPRESSION:  1.  Soft tissue swelling without a significant osseous abnormality.   Original Report Authenticated By: Marin Roberts, M.D.      1. Contusion of right thumb       MDM  Xray neg as above. No subungual hematoma in need of trephination. Pt declines pain meds. Advised to continue ice and elevation.        Gaylyn Berish B. Bernette Mayers, MD 04/03/12 1500

## 2012-07-08 ENCOUNTER — Encounter: Payer: BC Managed Care – PPO | Admitting: Nurse Practitioner

## 2012-07-08 NOTE — Progress Notes (Signed)
This encounter was created in error - please disregard.

## 2012-09-14 ENCOUNTER — Emergency Department (HOSPITAL_BASED_OUTPATIENT_CLINIC_OR_DEPARTMENT_OTHER)
Admission: EM | Admit: 2012-09-14 | Discharge: 2012-09-14 | Disposition: A | Discharge status: Home / Self Care | Payer: BC Managed Care – PPO | Attending: Emergency Medicine | Admitting: Emergency Medicine

## 2012-09-14 ENCOUNTER — Encounter (HOSPITAL_BASED_OUTPATIENT_CLINIC_OR_DEPARTMENT_OTHER): Payer: Self-pay

## 2012-09-14 DIAGNOSIS — R21 Rash and other nonspecific skin eruption: Secondary | ICD-10-CM | POA: Insufficient documentation

## 2012-09-14 DIAGNOSIS — Z79899 Other long term (current) drug therapy: Secondary | ICD-10-CM | POA: Insufficient documentation

## 2012-09-14 DIAGNOSIS — Z87442 Personal history of urinary calculi: Secondary | ICD-10-CM | POA: Insufficient documentation

## 2012-09-14 DIAGNOSIS — R0602 Shortness of breath: Secondary | ICD-10-CM | POA: Insufficient documentation

## 2012-09-14 DIAGNOSIS — F17200 Nicotine dependence, unspecified, uncomplicated: Secondary | ICD-10-CM | POA: Insufficient documentation

## 2012-09-14 DIAGNOSIS — R1310 Dysphagia, unspecified: Secondary | ICD-10-CM | POA: Insufficient documentation

## 2012-09-14 DIAGNOSIS — T364X5A Adverse effect of tetracyclines, initial encounter: Secondary | ICD-10-CM | POA: Insufficient documentation

## 2012-09-14 DIAGNOSIS — T7840XA Allergy, unspecified, initial encounter: Secondary | ICD-10-CM

## 2012-09-14 DIAGNOSIS — I10 Essential (primary) hypertension: Secondary | ICD-10-CM | POA: Insufficient documentation

## 2012-09-14 MED ORDER — DIPHENHYDRAMINE HCL 50 MG/ML IJ SOLN
50.0000 mg | Freq: Once | INTRAMUSCULAR | Status: AC
  Administered 2012-09-14: 50 mg via INTRAVENOUS
  Filled 2012-09-14: qty 1

## 2012-09-14 MED ORDER — METHYLPREDNISOLONE SODIUM SUCC 125 MG IJ SOLR
125.0000 mg | Freq: Once | INTRAMUSCULAR | Status: AC
  Administered 2012-09-14: 125 mg via INTRAVENOUS
  Filled 2012-09-14: qty 2

## 2012-09-14 MED ORDER — FAMOTIDINE IN NACL 20-0.9 MG/50ML-% IV SOLN
20.0000 mg | Freq: Once | INTRAVENOUS | Status: AC
  Administered 2012-09-14: 20 mg via INTRAVENOUS
  Filled 2012-09-14: qty 50

## 2012-09-14 MED ORDER — PREDNISONE 20 MG PO TABS: ORAL_TABLET | ORAL | Status: DC

## 2012-09-14 NOTE — ED Notes (Signed)
Pt reports difficulty swallowing, a rash and redness to face and neck that started this am.  Recently started on Doxycycline and Prednisone for a possible spider bite.

## 2012-09-14 NOTE — ED Provider Notes (Signed)
History    CSN: [id number] Arrival date & time 09/14/12  0744  First MD Initiated Contact with Patient 09/14/12 [phone]     Chief Complaint  Patient presents with  . Allergic Reaction   (Consider location/radiation/quality/duration/timing/severity/associated sxs/prior Treatment) HPI Comments: Patient woke up with diffuse red rash, difficulty swallowing and mild shortness of breath. Patient reports that he has been on doxycycline for one week for a spider bite. Patient also just finished a course of prednisone.  Patient is a 37 y.o. male presenting with allergic reaction.  Allergic Reaction Presenting symptoms: difficulty swallowing and rash    Past Medical History  Diagnosis Date  . Kidney stones   . Hypertension    Past Surgical History  Procedure Laterality Date  . Hernia repair      age 66   Family History  Problem Relation Age of Onset  . Arthritis Father   . Heart disease Father   . Hypertension Father   . Stroke Father   . Diabetes Father   . Mental illness Daughter    History  Substance Use Topics  . Smoking status: Current Every Day Smoker -- 0.50 packs/day    Types: Cigarettes  . Smokeless tobacco: Not on file  . Alcohol Use: Yes     Comment: weekly    Review of Systems  HENT: Positive for trouble swallowing.   Respiratory: Positive for shortness of breath.   Skin: Positive for rash.  All other systems reviewed and are negative.    Allergies  Bee venom  Home Medications   Current Outpatient Rx  Name  Route  Sig  Dispense  Refill  . doxycycline (DORYX) 100 MG EC tablet   Oral   Take 100 mg by mouth 2 (two) times daily.         . Escitalopram Oxalate (LEXAPRO PO)   Oral   Take by mouth.         Marland Kitchen lisinopril (PRINIVIL,ZESTRIL) 20 MG tablet   Oral   Take 1 tablet (20 mg total) by mouth daily.   90 tablet   3    BP 155/90  Pulse 64  Temp(Src) 97.9 F (36.6 C) (Oral)  Resp 20  SpO2 100% Physical Exam  Constitutional: He is  oriented to person, place, and time. He appears well-developed and well-nourished. No distress.  HENT:  Head: Normocephalic and atraumatic.  Right Ear: Hearing normal.  Left Ear: Hearing normal.  Nose: Nose normal.  Mouth/Throat: Oropharynx is clear and moist and mucous membranes are normal.  Eyes: Conjunctivae and EOM are normal. Pupils are equal, round, and reactive to light.  Neck: Normal range of motion. Neck supple.  Cardiovascular: Regular rhythm, S1 normal and S2 normal.  Exam reveals no gallop and no friction rub.   No murmur heard. Pulmonary/Chest: Effort normal and breath sounds normal. No respiratory distress. He exhibits no tenderness.  Abdominal: Soft. Normal appearance and bowel sounds are normal. There is no hepatosplenomegaly. There is no tenderness. There is no rebound, no guarding, no tenderness at McBurney's point and negative Murphy's sign. No hernia.  Musculoskeletal: Normal range of motion.  Neurological: He is alert and oriented to person, place, and time. He has normal strength. No cranial nerve deficit or sensory deficit. Coordination normal. GCS eye subscore is 4. GCS verbal subscore is 5. GCS motor subscore is 6.  Skin: Skin is warm, dry and intact. Rash noted. No cyanosis.  Diffuse erythroderma neck and torso  Psychiatric: He has a normal mood  and affect. His speech is normal and behavior is normal. Thought content normal.    ED Course  Procedures (including critical care time) Labs Reviewed - No data to display No results found. Diagnosis: Acute allergic reaction to doxycycline  MDM  She presents to the ER with diffuse rash thought to be secondary to doxycycline use. Patient subjectively felt like he was having trouble swallowing, however examination of his mouth did not reveal any evidence of tongue swelling or angioedema. He felt slightly short of breath, but likewise lung sounds were normal, patient's vital signs were normal. Patient administered IV  Benadryl, IV Pepcid and Solu-Medrol and observed. Rash is resolved. Patient symptomatically feeling better, no further sensation of throat swelling or shortness of breath. Patient will be discharged, continue to use Benadryl for 48 hours every 6 hours and then as needed. Will be restarted on prednisone in a tapering dose. Patient counseled to return to the ER or call 911 for rebound symptoms.  Gilda Crease, MD 09/14/12 [phone]

## 2012-09-14 NOTE — ED Notes (Signed)
RT assessed patient upon arrival. Stated that he was having some trouble breathing due to allergic reaction. SAT 100% on RA, no distress noted at this time, but slight exp. Wheeze in LUL. Neck red and swollen. RT will continue to monitor.

## 2012-09-16 ENCOUNTER — Ambulatory Visit (INDEPENDENT_AMBULATORY_CARE_PROVIDER_SITE_OTHER): Payer: BC Managed Care – PPO | Admitting: Family Medicine

## 2012-09-16 ENCOUNTER — Encounter: Payer: Self-pay | Admitting: Family Medicine

## 2012-09-16 VITALS — BP 120/80 | HR 78 | Temp 98.4°F | Ht 72.25 in | Wt 277.4 lb

## 2012-09-16 DIAGNOSIS — Z720 Tobacco use: Secondary | ICD-10-CM

## 2012-09-16 DIAGNOSIS — F17200 Nicotine dependence, unspecified, uncomplicated: Secondary | ICD-10-CM

## 2012-09-16 DIAGNOSIS — L259 Unspecified contact dermatitis, unspecified cause: Secondary | ICD-10-CM

## 2012-09-16 MED ORDER — VARENICLINE TARTRATE 1 MG PO TABS: 1.0000 mg | ORAL_TABLET | Freq: Two times a day (BID) | ORAL | Status: DC

## 2012-09-16 MED ORDER — TRIAMCINOLONE ACETONIDE 0.1 % EX OINT: TOPICAL_OINTMENT | Freq: Two times a day (BID) | CUTANEOUS | Status: DC

## 2012-09-16 MED ORDER — VARENICLINE TARTRATE 0.5 MG X 11 & 1 MG X 42 PO MISC: ORAL | Status: AC

## 2012-09-16 NOTE — Progress Notes (Signed)
  Subjective:    Patient ID: Paul Young, male    DOB: 02-21-1976, 37 y.o.   MRN: [id number]  HPI Rash- pt reports area started on R elbow and then spread.  Areas affected are only arms- not hands, not upper arms, not trunk, legs, groin.  Very itchy.  On prednisone.  Was started Doxy by UC doc for possible spider bite.  After taking Doxy, pt developed hives, SOB, rash (now on allergy list).  Pt reports he was initially treated for poison ivy by same UC doc x5 days.  Shortly after, rash returned.  Tobacco use- pt would like to try Chantix again to quit smoking.  Has taken before w/out complication or side effect.   Review of Systems For ROS see HPI     Objective:   Physical Exam  Vitals reviewed. Constitutional: He is oriented to person, place, and time. He appears well-developed and well-nourished. No distress.  HENT:  Head: Normocephalic and atraumatic.  Neck: Normal range of motion. Neck supple.  Musculoskeletal: He exhibits no edema and no tenderness.  Lymphadenopathy:    He has no cervical adenopathy.  Neurological: He is alert and oriented to person, place, and time.  Skin: Skin is warm and dry. Rash (vesicular, linear rash on both forearms and neck consistent w/ contact dermatitis) noted.  Psychiatric: He has a normal mood and affect. His behavior is normal.          Assessment & Plan:

## 2012-09-16 NOTE — Patient Instructions (Signed)
Take the Pred taper as directed by the ER Start the starting month Chantix and then switch to the continuation pack Apply the Triamcinolone ointment twice daily Call with any questions or concerns CONGRATS on the wedding!

## 2012-09-16 NOTE — Assessment & Plan Note (Signed)
New.  Suspect pt had rebound dermatitis from inadequately treated poison ivy the first time (5 days of steroids rather than 10-14).  Encouraged him to start pred taper as prescribed in ER.  Use Triamcinolone ointment prn.  Reviewed supportive care and red flags that should prompt return.  Pt expressed understanding and is in agreement w/ plan.

## 2012-09-16 NOTE — Assessment & Plan Note (Signed)
Chronic problem.  Pt would like to again try Chantix.  Scripts for both starting pack and continuation pack given along w/ instructions for use.  Pt expressed understanding and is in agreement w/ plan.

## 2012-11-24 ENCOUNTER — Ambulatory Visit: Payer: BC Managed Care – PPO | Admitting: Emergency Medicine

## 2012-11-24 VITALS — BP 124/78 | HR 84 | Temp 98.4°F | Resp 20 | Ht 71.5 in | Wt 275.5 lb

## 2012-11-24 DIAGNOSIS — M7662 Achilles tendinitis, left leg: Secondary | ICD-10-CM

## 2012-11-24 DIAGNOSIS — M7660 Achilles tendinitis, unspecified leg: Secondary | ICD-10-CM

## 2012-11-24 MED ORDER — NAPROXEN SODIUM 550 MG PO TABS: 550.0000 mg | ORAL_TABLET | Freq: Two times a day (BID) | ORAL | Status: DC

## 2012-11-24 NOTE — Progress Notes (Signed)
[facility] [address], [place] [address] [phone]  Date:  11/24/2012   Name:  Paul Young   DOB:  04-18-1975   MRN:  [id number]  PCP:  Neena Rhymes, MD    Chief Complaint: heal pain   History of Present Illness:  Paul Young is a 37 y.o. very pleasant male patient who presents with the following:  1 week history of pain in left heel.  No history of injury.  Worse when stoops down to work.  Worse when does forced dorsiflexion of ankle.  No improvement with over the counter medications or other home remedies. Denies other complaint or health concern today.   Patient Active Problem List   Diagnosis Date Noted  . Contact dermatitis 09/16/2012  . Lumbar radicular pain 01/03/2012  . Hyperglycemia 08/24/2011  . Fungal infection of foot 08/24/2011  . HTN (hypertension) 08/24/2011  . Anxiety and depression 06/22/2011  . Folliculitis barbae 06/22/2011  . Obesity 06/22/2011  . Dermatofibroma 01/26/2011  . Contact with or exposure to other viral diseases 01/26/2011  . ED (erectile dysfunction) 01/26/2011  . Tobacco abuse 06/30/2010  . General medical examination 06/30/2010    Past Medical History  Diagnosis Date  . Hypertension     Past Surgical History  Procedure Laterality Date  . Hernia repair      age 42    History  Substance Use Topics  . Smoking status: Current Every Day Smoker -- 1.00 packs/day    Types: Cigarettes  . Smokeless tobacco: Not on file  . Alcohol Use: Yes     Comment: weekly    Family History  Problem Relation Age of Onset  . Arthritis Father   . Heart disease Father   . Hypertension Father   . Stroke Father   . Diabetes Father   . Mental illness Daughter   . Asthma Daughter     Allergies  Allergen Reactions  . Bee Venom Anaphylaxis  . Doxycycline Hives, Shortness Of Breath and Rash    Throat swelling    Medication list has been reviewed and updated.  Current Outpatient Prescriptions on  File Prior to Visit  Medication Sig Dispense Refill  . lisinopril (PRINIVIL,ZESTRIL) 20 MG tablet Take 1 tablet (20 mg total) by mouth daily.  90 tablet  3  . varenicline (CHANTIX CONTINUING MONTH PAK) 1 MG tablet Take 1 tablet (1 mg total) by mouth 2 (two) times daily.  60 tablet  2  . varenicline (CHANTIX STARTING MONTH PAK) 0.5 MG X 11 & 1 MG X 42 tablet Take one 0.5mg  tablet by mouth once daily for 3 days, then increase to one 0.5mg  tablet twice daily for 3 days, then increase to one 1mg  tablet twice daily.  53 tablet  0  . Escitalopram Oxalate (LEXAPRO PO) Take by mouth.      . predniSONE (DELTASONE) 20 MG tablet 3 tabs po daily x 3 days, then 2 tabs x 3 days, then 1.5 tabs x 3 days, then 1 tab x 3 days, then 0.5 tabs x 3 days  27 tablet  0  . triamcinolone ointment (KENALOG) 0.1 % Apply topically 2 (two) times daily.  90 g  1   No current facility-administered medications on file prior to visit.    Review of Systems:  As per HPI, otherwise negative.    Physical Examination: Filed Vitals:   11/24/12 1328  BP: 124/78  Pulse: 84  Temp: 98.4 F (36.9 C)  Resp: 20  Filed Vitals:   11/24/12 1328  Height: 5' 11.5" (1.816 m)  Weight: 275 lb 8 oz (124.966 kg)   Body mass index is 37.89 kg/(m^2). Ideal Body Weight: Weight in (lb) to have BMI = 25: 181.4   GEN: WDWN, NAD, Non-toxic, Alert & Oriented x 3 HEENT: Atraumatic, Normocephalic.  Ears and Nose: No external deformity. EXTR: No clubbing/cyanosis/edema NEURO: Normal gait.  PSYCH: Normally interactive. Conversant. Not depressed or anxious appearing.  Calm demeanor.  Left ankle:  Tender insertion of achilles tendon.  Severe athletes foot.  Assessment and Plan: Achilles tendonitis  Signed,  Phillips Odor, MD

## 2012-11-24 NOTE — Patient Instructions (Signed)
Achilles Tendinitis  Tendinitis a swelling and soreness of the tendon. The pain in the tendon (cord-like structure which attaches muscle to bone) is produced by tiny tears and the inflammation present in that tendon. It commonly occurs at the shoulders, heels, and elbows. It is usually caused by overusing the tendon and joint involved. Achilles tendinitis involves the Achilles tendon. This is the large tendon in the back of the leg just above the foot. It attaches the large muscles of the lower leg to the heel bone (called calcaneus).   This diagnosis (learning what is wrong) is made by examination. X-rays will be generally be normal if only tendinitis is present.  HOME CARE INSTRUCTIONS    Apply ice to the injury for 15-20 minutes, 3-4 times per day. Put the ice in a plastic bag and place a towel between the bag of ice and your skin.   Try to avoid use other than gentle range of motion while the tendon is painful. Do not resume use until instructed by your caregiver. Then begin use gradually. Do not increase use to the point of pain. If pain does develop, decrease use and continue the above measures. Gradually increase activities that do not cause discomfort until you gradually achieve normal use.   Only take over-the-counter or prescription medicines for pain, discomfort, or fever as directed by your caregiver.  SEEK MEDICAL CARE IF:    Your pain and swelling increase or pain is uncontrolled with medications.   You develop new, unexplained problems (symptoms) or an increase of the symptoms that brought you to your caregiver.   You develop an inability to move your toes or foot, develop warmth and swelling in your foot, or begin running an unexplained temperature.  MAKE SURE YOU:    Understand these instructions.   Will watch your condition.   Will get help right away if you are not doing well or get worse.  Document Released: 11/23/2004 Document Revised: 05/08/2011 Document Reviewed: 10/02/2007  [employer]  Patient Information 2014 [employer].

## 2012-12-09 ENCOUNTER — Telehealth: Payer: Self-pay | Admitting: *Deleted

## 2012-12-09 NOTE — Telephone Encounter (Signed)
Best next step is seeing Dr Terrilee Files (sports med) at [facility]

## 2012-12-09 NOTE — Telephone Encounter (Signed)
Pt called and stated that he was seen at an urgent care for achilles tendonitis on 11/24/2012. Pt states that he was only giving naproxen. Pt states that he is still he a lot of pain. 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 Pt wants to know what is the next step for him. He dosen't know if you would like to refer him to someone or make an appointment to see you. Please Advise.

## 2012-12-09 NOTE — Telephone Encounter (Signed)
Pt.notified

## 2012-12-10 ENCOUNTER — Ambulatory Visit: Payer: BC Managed Care – PPO | Admitting: Family Medicine

## 2012-12-10 DIAGNOSIS — Z0289 Encounter for other administrative examinations: Secondary | ICD-10-CM

## 2013-02-12 ENCOUNTER — Telehealth: Payer: Self-pay | Admitting: Family Medicine

## 2013-02-12 MED ORDER — FLUOXETINE HCL 20 MG PO TABS: 20.0000 mg | ORAL_TABLET | Freq: Every day | ORAL | Status: DC

## 2013-02-12 NOTE — Telephone Encounter (Signed)
Can switch to Prozac 20mg  daily, #30, 6 refills

## 2013-02-12 NOTE — Telephone Encounter (Signed)
Med filled and pt notified.  

## 2013-02-12 NOTE — Telephone Encounter (Signed)
Patient called and stated that dr Beverely Low prescribe him Escitalopram Oxalate (LEXAPRO PO) and its making him gain weight. He wanted to see if dr Beverely Low would prescribe him something else.

## 2013-05-25 ENCOUNTER — Emergency Department (HOSPITAL_COMMUNITY): Payer: BC Managed Care – PPO

## 2013-05-25 ENCOUNTER — Emergency Department (HOSPITAL_COMMUNITY)
Admission: EM | Admit: 2013-05-25 | Discharge: 2013-05-25 | Disposition: A | Discharge status: Home / Self Care | Payer: BC Managed Care – PPO | Attending: Emergency Medicine | Admitting: Emergency Medicine

## 2013-05-25 ENCOUNTER — Encounter (HOSPITAL_COMMUNITY): Payer: Self-pay | Admitting: Emergency Medicine

## 2013-05-25 DIAGNOSIS — IMO0002 Reserved for concepts with insufficient information to code with codable children: Secondary | ICD-10-CM | POA: Insufficient documentation

## 2013-05-25 DIAGNOSIS — S5010XA Contusion of unspecified forearm, initial encounter: Secondary | ICD-10-CM | POA: Insufficient documentation

## 2013-05-25 DIAGNOSIS — Z79899 Other long term (current) drug therapy: Secondary | ICD-10-CM | POA: Insufficient documentation

## 2013-05-25 DIAGNOSIS — S5011XA Contusion of right forearm, initial encounter: Secondary | ICD-10-CM

## 2013-05-25 DIAGNOSIS — I10 Essential (primary) hypertension: Secondary | ICD-10-CM | POA: Insufficient documentation

## 2013-05-25 DIAGNOSIS — F17200 Nicotine dependence, unspecified, uncomplicated: Secondary | ICD-10-CM | POA: Insufficient documentation
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
## 2013-05-25 NOTE — ED Provider Notes (Signed)
Medical screening examination/treatment/procedure(s) were performed by non-physician practitioner and as supervising physician I was immediately available for consultation/collaboration.     Arnie Clingenpeel, MD 05/25/13 2319 
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
## 2013-05-25 NOTE — Discharge Instructions (Signed)
1. Medications: ibuprofen 800mg  three times per day, usual home medications 2. Treatment: rest, drink plenty of fluids, use ACE wrap and ice for swelling and pain 3. Follow Up: Please followup with your primary doctor for discussion of your diagnoses and further evaluation after today's visit;    Contusion A contusion is a deep bruise. Contusions are the result of an injury that caused bleeding under the skin. The contusion may turn blue, purple, or yellow. Minor injuries will give you a painless contusion, but more severe contusions may stay painful and swollen for a few weeks.  CAUSES  A contusion is usually caused by a blow, trauma, or direct force to an area of the body. SYMPTOMS   Swelling and redness of the injured area.  Bruising of the injured area.  Tenderness and soreness of the injured area.  Pain. DIAGNOSIS  The diagnosis can be made by taking a history and physical exam. An X-ray, CT scan, or MRI may be needed to determine if there were any associated injuries, such as fractures. TREATMENT  Specific treatment will depend on what area of the body was injured. In general, the best treatment for a contusion is resting, icing, elevating, and applying cold compresses to the injured area. Over-the-counter medicines may also be recommended for pain control. Ask your caregiver what the best treatment is for your contusion. HOME CARE INSTRUCTIONS   Put ice on the injured area.  Put ice in a plastic bag.  Place a towel between your skin and the bag.  Leave the ice on for 15-20 minutes, 03-04 times a day.  Only take over-the-counter or prescription medicines for pain, discomfort, or fever as directed by your caregiver. Your caregiver may recommend avoiding anti-inflammatory medicines (aspirin, ibuprofen, and naproxen) for 48 hours because these medicines may increase bruising.  Rest the injured area.  If possible, elevate the injured area to reduce swelling. SEEK IMMEDIATE  MEDICAL CARE IF:   You have increased bruising or swelling.  You have pain that is getting worse.  Your swelling or pain is not relieved with medicines. MAKE SURE YOU:   Understand these instructions.  Will watch your condition.  Will get help right away if you are not doing well or get worse. Document Released: 11/23/2004 Document Revised: 05/08/2011 Document Reviewed: 12/19/2010 [facility] Patient Information 2014 [place], [place].

## 2013-05-30 ENCOUNTER — Other Ambulatory Visit: Payer: Self-pay | Admitting: Family Medicine

## 2013-06-02 NOTE — Telephone Encounter (Signed)
Last ov 09-16-12 (contact derm) Last labs 2013

## 2013-06-02 NOTE — Telephone Encounter (Signed)
Ok for #30 but is overdue for physical.  If unable to schedule PE, at least needs appt for BP

## 2013-06-02 NOTE — Telephone Encounter (Signed)
Med filled #30 and letter mailed.

## 2013-07-26 ENCOUNTER — Other Ambulatory Visit: Payer: Self-pay | Admitting: Family Medicine

## 2013-07-28 NOTE — Telephone Encounter (Signed)
Med filled.  

## 2013-09-29 ENCOUNTER — Telehealth: Payer: Self-pay | Admitting: *Deleted

## 2013-09-29 MED ORDER — LISINOPRIL 20 MG PO TABS: ORAL_TABLET | ORAL | Status: DC

## 2013-09-29 MED ORDER — ESCITALOPRAM OXALATE 20 MG PO TABS: ORAL_TABLET | ORAL | Status: DC

## 2013-09-29 NOTE — Telephone Encounter (Signed)
Caller name:  Khaleem Relation to pt:  self Call back number: [phone]  Pharmacy: [facility] on [address]  Reason for call: Pt called to schedule appt for Medication refill, has appt 10/20/13 with Tabori.  Requesting refill on  escitalopram (LEXAPRO) 20 MG tablet  Last refill 07/28/13, #30, no refills Last OV 09/16/12  lisinopril (PRINIVIL,ZESTRIL) 20 MG tablet  Last refill 07/28/2013, #30, no refills

## 2013-09-29 NOTE — Telephone Encounter (Signed)
Med filled.  

## 2013-10-02 ENCOUNTER — Telehealth: Payer: Self-pay | Admitting: Family Medicine

## 2013-10-02 NOTE — Telephone Encounter (Signed)
Caller name: Relation to pt: Call back number:[phone] Pharmacy:  Reason for call:

## 2013-10-02 NOTE — Telephone Encounter (Signed)
Caller name: Castulo Relation to pt: self   Call back number: [phone] Pharmacy:  Reason for call: pt is requesting a refill

## 2013-10-03 MED ORDER — ESCITALOPRAM OXALATE 20 MG PO TABS: ORAL_TABLET | ORAL | Status: DC

## 2013-10-03 MED ORDER — LISINOPRIL 20 MG PO TABS: ORAL_TABLET | ORAL | Status: DC

## 2013-10-03 NOTE — Telephone Encounter (Signed)
Caller name: Winnie  Relation to pt: Call back number: [phone] Pharmacy:   [facility] [address]   Reason for call:   Please see previous phone messages regarding medications.  Pt's states insurance will not cover the #30 but only cover the #90.  Pt needs these cause he has had to be out of work due to not having meds. Please advise.

## 2013-10-03 NOTE — Telephone Encounter (Signed)
These medications were filled #90 today.

## 2013-10-14 ENCOUNTER — Telehealth: Payer: Self-pay

## 2013-10-14 NOTE — Telephone Encounter (Signed)
Received FMLA paperwork from patient on 10/09/13.  Pt's last office visit greater than 6 months (09/16/12).  Pt needs an office visit.  Left a message for call back.

## 2013-10-14 NOTE — Telephone Encounter (Signed)
Appointment scheduled for 10/16/13 @ 2:30 pm.

## 2013-10-16 ENCOUNTER — Encounter: Payer: Self-pay | Admitting: Family Medicine

## 2013-10-16 ENCOUNTER — Ambulatory Visit (INDEPENDENT_AMBULATORY_CARE_PROVIDER_SITE_OTHER): Payer: BC Managed Care – PPO | Admitting: Family Medicine

## 2013-10-16 VITALS — BP 130/84 | HR 78 | Temp 98.0°F | Resp 16 | Wt 275.4 lb

## 2013-10-16 DIAGNOSIS — I10 Essential (primary) hypertension: Secondary | ICD-10-CM

## 2013-10-16 DIAGNOSIS — F341 Dysthymic disorder: Secondary | ICD-10-CM

## 2013-10-16 DIAGNOSIS — E669 Obesity, unspecified: Secondary | ICD-10-CM

## 2013-10-16 DIAGNOSIS — Z23 Encounter for immunization: Secondary | ICD-10-CM

## 2013-10-16 DIAGNOSIS — F419 Anxiety disorder, unspecified: Secondary | ICD-10-CM

## 2013-10-16 DIAGNOSIS — F329 Major depressive disorder, single episode, unspecified: Secondary | ICD-10-CM

## 2013-10-16 MED ORDER — BUPROPION HCL ER (XL) 150 MG PO TB24: 150.0000 mg | ORAL_TABLET | Freq: Every day | ORAL | Status: DC

## 2013-10-16 NOTE — Progress Notes (Signed)
Pre visit review using our clinic review tool, if applicable. No additional management support is needed unless otherwise documented below in the visit note. 

## 2013-10-16 NOTE — Patient Instructions (Signed)
Follow up in 4-6 weeks to recheck mood Decrease the Lexapro to 10 mg daily (1/2 tab) x1-2 weeks and then stop Start the Wellbutrin daily- it can overlap the Lexapro for 1 week We'll notify you of your lab results and make any changes if needed Continue the Lisinopril Try and make healthy diet and regular exercise Call with any questions or concerns Hang in there and CONGRATS!

## 2013-10-16 NOTE — Progress Notes (Signed)
   Subjective:    Patient ID: Paul Young, male    DOB: June 15, 1975, 38 y.o.   MRN: [id number]  HPI HTN- chronic problem, pt reports he has been off BP meds x4 weeks.  Started 'feeling bad'- HA, dizziness, chest tightness.  Denies current CP, SOB, HAs, visual changes, edema.  Obesity- chronic problem.  Pt reports he is not allowed to work for 90 days b/c he is '1 lb overweight'.  Has 'not changed eating habits since he weighed 220 lbs' but indicates he started to gain weight after starting Lexapro.  Pt interested in switching to Wellbutrin for anxiety and weight management.   Review of Systems For ROS see HPI     Objective:   Physical Exam  Vitals reviewed. Constitutional: He is oriented to person, place, and time. He appears well-developed and well-nourished. No distress.  obesity  HENT:  Head: Normocephalic and atraumatic.  Eyes: Conjunctivae and EOM are normal. Pupils are equal, round, and reactive to light.  Neck: Normal range of motion. Neck supple. No thyromegaly present.  Cardiovascular: Normal rate, regular rhythm, normal heart sounds and intact distal pulses.   No murmur heard. Pulmonary/Chest: Effort normal and breath sounds normal. No respiratory distress.  Abdominal: Soft. Bowel sounds are normal. He exhibits no distension.  Musculoskeletal: He exhibits no edema.  Lymphadenopathy:    He has no cervical adenopathy.  Neurological: He is alert and oriented to person, place, and time. No cranial nerve deficit.  Skin: Skin is warm and dry.  Psychiatric: He has a normal mood and affect. His behavior is normal.          Assessment & Plan:

## 2013-10-17 ENCOUNTER — Telehealth: Payer: Self-pay | Admitting: Family Medicine

## 2013-10-17 NOTE — Telephone Encounter (Signed)
Relevant patient education mailed to patient.  

## 2013-10-17 NOTE — Telephone Encounter (Addendum)
FMLA completed and sent to be scanned. Zero charge.

## 2013-10-18 LAB — HEPATIC FUNCTION PANEL
ALT: 30 U/L (ref 0–53)
AST: 31 U/L (ref 0–37)
Albumin: 4.7 g/dL (ref 3.5–5.2)
Alkaline Phosphatase: 46 U/L (ref 39–117)
BILIRUBIN DIRECT: 0.2 mg/dL (ref 0.0–0.3)
BILIRUBIN TOTAL: 1 mg/dL (ref 0.2–1.2)
TOTAL PROTEIN: 7.1 g/dL (ref 6.0–8.3)

## 2013-10-18 LAB — CBC WITH DIFFERENTIAL/PLATELET
Basophils Absolute: 0 10*3/uL (ref 0.0–0.1)
Basophils Relative: 0.3 % (ref 0.0–3.0)
EOS PCT: 0.4 % (ref 0.0–5.0)
Eosinophils Absolute: 0 10*3/uL (ref 0.0–0.7)
HEMATOCRIT: 46.4 % (ref 39.0–52.0)
Hemoglobin: 15.8 g/dL (ref 13.0–17.0)
Lymphocytes Relative: 23.6 % (ref 12.0–46.0)
Lymphs Abs: 1.7 10*3/uL (ref 0.7–4.0)
MCHC: 34 g/dL (ref 30.0–36.0)
MCV: 89.4 fl (ref 78.0–100.0)
MONO ABS: 0.3 10*3/uL (ref 0.1–1.0)
MONOS PCT: 3.6 % (ref 3.0–12.0)
NEUTROS PCT: 72.1 % (ref 43.0–77.0)
Neutro Abs: 5.3 10*3/uL (ref 1.4–7.7)
Platelets: 245 10*3/uL (ref 150.0–400.0)
RBC: 5.19 Mil/uL (ref 4.22–5.81)
RDW: 13.4 % (ref 11.5–15.5)
WBC: 7.3 10*3/uL (ref 4.0–10.5)

## 2013-10-18 LAB — BASIC METABOLIC PANEL
BUN: 10 mg/dL (ref 6–23)
CALCIUM: 9.9 mg/dL (ref 8.4–10.5)
CO2: 24 mEq/L (ref 19–32)
CREATININE: 1 mg/dL (ref 0.4–1.5)
Chloride: 103 mEq/L (ref 96–112)
GFR: 91.7 mL/min (ref >60.00)
Glucose, Bld: 71 mg/dL (ref 70–99)
Potassium: 4.3 mEq/L (ref 3.5–5.1)
Sodium: 138 mEq/L (ref 135–145)

## 2013-10-18 LAB — LIPID PANEL
Cholesterol: 161 mg/dL (ref 0–200)
HDL: 56.3 mg/dL (ref >39.00)
LDL Cholesterol: 84 mg/dL (ref 0–99)
NONHDL: 104.7
Total CHOL/HDL Ratio: 3
Triglycerides: 106 mg/dL (ref 0.0–149.0)
VLDL: 21.2 mg/dL (ref 0.0–40.0)

## 2013-10-18 LAB — TSH: TSH: 0.59 u[IU]/mL (ref 0.35–4.50)

## 2013-10-18 NOTE — Assessment & Plan Note (Signed)
sxs are well controlled but pt had considerable weight gain since starting Lexapro.  Will switch to Wellbutrin.  Monitor for symptom improvement, side effects, and weight loss.

## 2013-10-18 NOTE — Assessment & Plan Note (Signed)
Deteriorated as pt has gained weight.  Now not able to work according to job requirements b/c 'i'm 1 lb over'.  Check labs to risk stratify.  Stressed importance of healthy diet and regular exercise.  Will follow.

## 2013-10-18 NOTE — Assessment & Plan Note (Signed)
BP adequately controlled since resuming meds.  Pt now asymptomatic since restarting Lisinopril.  Check labs.  No anticipated med changes.

## 2013-10-20 ENCOUNTER — Ambulatory Visit: Payer: BC Managed Care – PPO | Admitting: Family Medicine

## 2013-10-20 ENCOUNTER — Encounter: Payer: Self-pay | Admitting: General Practice

## 2013-10-23 ENCOUNTER — Telehealth: Payer: Self-pay | Admitting: Family Medicine

## 2013-10-23 NOTE — Telephone Encounter (Signed)
Spoke with pt and advised that forms were re-submitted. tabori did miss a box. Fixed and faxed.

## 2013-10-23 NOTE — Telephone Encounter (Signed)
Caller name: Previn  Relation to pt: self  Call back number: [phone]   Reason for call: pt wanted to inform you FMLA denied due to missing category for pt condition.

## 2013-10-23 NOTE — Telephone Encounter (Signed)
Please re-submit forms.  I checked the appropriate box on the 1st page

## 2013-10-29 ENCOUNTER — Encounter: Payer: Self-pay | Admitting: Family Medicine

## 2013-10-29 ENCOUNTER — Ambulatory Visit (INDEPENDENT_AMBULATORY_CARE_PROVIDER_SITE_OTHER): Payer: BC Managed Care – PPO | Admitting: Family Medicine

## 2013-10-29 VITALS — BP 120/82 | HR 83 | Temp 98.3°F | Resp 16 | Wt 273.1 lb

## 2013-10-29 DIAGNOSIS — J0100 Acute maxillary sinusitis, unspecified: Secondary | ICD-10-CM

## 2013-10-29 MED ORDER — AMOXICILLIN 875 MG PO TABS: 875.0000 mg | ORAL_TABLET | Freq: Two times a day (BID) | ORAL | Status: DC

## 2013-10-29 MED ORDER — PROMETHAZINE-DM 6.25-15 MG/5ML PO SYRP: 5.0000 mL | ORAL_SOLUTION | Freq: Four times a day (QID) | ORAL | Status: DC | PRN

## 2013-10-29 NOTE — Patient Instructions (Signed)
Follow up as needed Start the Amoxicillin twice daily (with food) for the sinus infection Drink plenty of fluids Start the cough syrup for night- will cause drowsiness Continue Mucinex DM for daytime cough Call with any questions or concerns Hang in there!!!

## 2013-10-29 NOTE — Progress Notes (Signed)
Pre visit review using our clinic review tool, if applicable. No additional management support is needed unless otherwise documented below in the visit note. 

## 2013-10-29 NOTE — Progress Notes (Signed)
   Subjective:    Patient ID: Paul Young, male    DOB: [birth year], 38 y.o.   MRN: [id number]  Sore Throat  Associated symptoms include coughing.  Sinusitis Associated symptoms include coughing.  Cough Associated symptoms include chest pain.  Chest Pain  Associated symptoms include a cough.   URI- sxs started 3 days ago w/ nasal congestion, chest congestion.  Taking mucinex w/o relief.  + cough- intermittently productive.  No fevers.  + maxillary sinus pain.  No tooth pain.  No ear pain.  + sick contacts.   Review of Systems  Respiratory: Positive for cough.   Cardiovascular: Positive for chest pain.   For ROS see HPI     Objective:   Physical Exam  Vitals reviewed. Constitutional: He appears well-developed and well-nourished. No distress.  HENT:  Head: Normocephalic and atraumatic.  Right Ear: Tympanic membrane normal.  Left Ear: Tympanic membrane normal.  Nose: Mucosal edema and rhinorrhea present. Right sinus exhibits maxillary sinus tenderness. Right sinus exhibits no frontal sinus tenderness. Left sinus exhibits maxillary sinus tenderness. Left sinus exhibits no frontal sinus tenderness.  Mouth/Throat: Mucous membranes are normal. Oropharyngeal exudate and posterior oropharyngeal erythema present. No posterior oropharyngeal edema.  + PND  Eyes: Conjunctivae and EOM are normal. Pupils are equal, round, and reactive to light.  Neck: Normal range of motion. Neck supple.  Cardiovascular: Normal rate, regular rhythm and normal heart sounds.   Pulmonary/Chest: Effort normal and breath sounds normal. No respiratory distress. He has no wheezes.  Lymphadenopathy:    He has no cervical adenopathy.  Skin: Skin is warm and dry.          Assessment & Plan:

## 2013-10-29 NOTE — Assessment & Plan Note (Signed)
New.  Pt's sxs and PE consistent w/ infxn.  Start abx.  Cough syrup prn.  Reviewed supportive care and red flags that should prompt return.  Pt expressed understanding and is in agreement w/ plan.

## 2013-10-31 ENCOUNTER — Ambulatory Visit (INDEPENDENT_AMBULATORY_CARE_PROVIDER_SITE_OTHER): Payer: BC Managed Care – PPO | Admitting: Family Medicine

## 2013-10-31 ENCOUNTER — Encounter: Payer: Self-pay | Admitting: Family Medicine

## 2013-10-31 VITALS — BP 120/80 | HR 73 | Temp 98.0°F | Resp 16 | Ht 72.0 in | Wt 273.5 lb

## 2013-10-31 DIAGNOSIS — E669 Obesity, unspecified: Secondary | ICD-10-CM

## 2013-10-31 NOTE — Progress Notes (Signed)
Pre visit review using our clinic review tool, if applicable. No additional management support is needed unless otherwise documented below in the visit note. 

## 2013-10-31 NOTE — Progress Notes (Signed)
   Subjective:    Patient ID: Paul Young, male    DOB: 1975-11-09, 38 y.o.   MRN: [id number]  HPI Obesity- chronic problem for pt.  BMI is currently 37.  Pt reports he is able to lose weight if following very restrictive diet but as soon as he eats 3 meals/day, the weight returns.  Pt reports his portions are 'normal size' and 'i just continue to gain weight'.  Work has weight restrictions and he is currently out of work.  Pt is interested in lap band procedure.  Insurance will pay as long as BMI is >35 and pt has comorbidity such as HTN.  Pt has not seen nutritionist.   Review of Systems For ROS see HPI     Objective:   Physical Exam  Vitals reviewed. Constitutional: He is oriented to person, place, and time. He appears well-developed and well-nourished. No distress.  obese  HENT:  Head: Normocephalic and atraumatic.  Eyes: Conjunctivae and EOM are normal. Pupils are equal, round, and reactive to light.  Neck: Normal range of motion. Neck supple. No thyromegaly present.  Cardiovascular: Normal rate, regular rhythm, normal heart sounds and intact distal pulses.   No murmur heard. Pulmonary/Chest: Effort normal and breath sounds normal. No respiratory distress.  Abdominal: Soft. Bowel sounds are normal. He exhibits no distension.  Musculoskeletal: He exhibits no edema.  Lymphadenopathy:    He has no cervical adenopathy.  Neurological: He is alert and oriented to person, place, and time. No cranial nerve deficit.  Skin: Skin is warm and dry.  Psychiatric: He has a normal mood and affect. His behavior is normal.          Assessment & Plan:

## 2013-10-31 NOTE — Assessment & Plan Note (Signed)
Pt now interested in bariatric surgery.  Will refer to nutrition to start the process and encouraged him to start making healthy diet choices and get regular exercise.  Pt to attend [facility] bariatric seminar.  According to his insurance company, he does meet criteria- BMI >35 and HTN.  Will follow.

## 2013-10-31 NOTE — Patient Instructions (Signed)
Go to [email or web address] to review the info and get registered for one of their upcoming informational seminars We'll call you with your nutrition appt Keep up the good work on healthy diet and regular exercise Call with any questions or concerns Happy Labor Day!

## 2013-11-06 ENCOUNTER — Encounter: Payer: Self-pay | Admitting: Dietician

## 2013-11-06 ENCOUNTER — Encounter: Payer: BC Managed Care – PPO | Attending: Family Medicine | Admitting: Dietician

## 2013-11-06 VITALS — Ht 72.0 in | Wt 271.9 lb

## 2013-11-06 DIAGNOSIS — E669 Obesity, unspecified: Secondary | ICD-10-CM

## 2013-11-06 DIAGNOSIS — Z6837 Body mass index (BMI) 37.0-37.9, adult: Secondary | ICD-10-CM | POA: Insufficient documentation

## 2013-11-06 DIAGNOSIS — Z713 Dietary counseling and surveillance: Secondary | ICD-10-CM | POA: Insufficient documentation

## 2013-11-06 NOTE — Patient Instructions (Addendum)
-  Mix up the snacks  -Raw vegetables, small serving of fruit, small serving of almonds, fat free cottage cheese  -Make sure you have low fat string cheese  -Continue to avoid high-fat meats  -Choose lean beef and pork  -Increase exercise  -At least 30 minutes on treadmill every day  -Stop smoking!!  -Start taking Chantix again  -Try only weighing yourself only 1x a week  -When you go back to work, bring some non-perishable snacks to have if lunch gets delayed   -Think of this plan as a lifestyle change  -Healthy weight loss: 1-2 lbs per week

## 2013-11-06 NOTE — Progress Notes (Signed)
  Medical Nutrition Therapy:  Appt start time: 1110 end time:  1205.   Assessment:  Primary concerns today: Paul Young is here today to discuss weight loss. He has lost weight in the past but has difficulty sustaining weight loss. He is considering LAGB and plans to attend the bariatric information seminar at [facility] next week. For the last 5 weeks he has been trying a low-carb diet and has lost a few pounds. 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 He is a smoker, 1 pack a day or less.  Preferred Learning Style:  No preference indicated   Learning Readiness:   Contemplating  Ready  MEDICATIONS: Wellbutrin and lisinopril   DIETARY INTAKE:  24-hr recall:  B ( AM): Triple Zero [place] yogurt and [place] tea  Snk ( AM): 2 cheese sticks  L ( PM): salad with cheese and veggies and [place] dressing OR broccoli and chicken Snk ( PM): 2 cheese sticks D ( PM): chicken or seafood and vegetables Snk ( PM): not usually  Beverages: mostly water, diet coke, diet cheerwine, diet green tea, rum mixed with diet soda  Usual physical activity: walking 40 min on treadmill almost every other day  Estimated energy needs: 2000-2200 calories  Progress Towards Goal(s):  In progress.   Nutritional Diagnosis:  [place]-3.3 Overweight/obesity As related to history of excessive energy intake.  As evidenced by BMI 37 and patient considering LAGB.    Intervention:  Nutrition counseling provided. -Mix up the snacks  -Raw vegetables, small serving of fruit, small serving of almonds, fat free cottage cheese  -Make sure you have low fat string cheese -Continue to avoid high-fat meats  -Choose lean beef and pork -Increase exercise  -At least 30 minutes on treadmill every day -Stop smoking!!  -Start taking Chantix again -Try only weighing yourself only 1x a week -When you go back to work, bring some non-perishable snacks to have if lunch gets delayed  -Think of this plan as a  lifestyle change -Healthy weight loss: 1-2 lbs per week  Teaching Method Utilized:  Visual Auditory  Handouts given during visit include:  Pre op goals  15g CHO + protein snacks  Barriers to learning/adherence to lifestyle change: stress  Demonstrated degree of understanding via:  Teach Back   Monitoring/Evaluation:  Dietary intake, exercise, and body weight prn.

## 2013-11-12 ENCOUNTER — Ambulatory Visit: Payer: BC Managed Care – PPO | Admitting: *Deleted

## 2013-11-12 ENCOUNTER — Other Ambulatory Visit: Payer: Self-pay | Admitting: General Practice

## 2013-11-12 MED ORDER — BUPROPION HCL ER (XL) 150 MG PO TB24: 150.0000 mg | ORAL_TABLET | Freq: Every day | ORAL | Status: DC

## 2013-11-17 ENCOUNTER — Encounter: Payer: Self-pay | Admitting: Family Medicine

## 2013-11-17 ENCOUNTER — Ambulatory Visit (INDEPENDENT_AMBULATORY_CARE_PROVIDER_SITE_OTHER): Payer: BC Managed Care – PPO | Admitting: Family Medicine

## 2013-11-17 VITALS — BP 122/80 | HR 75 | Temp 98.0°F | Resp 16 | Wt 274.2 lb

## 2013-11-17 DIAGNOSIS — Z23 Encounter for immunization: Secondary | ICD-10-CM

## 2013-11-17 DIAGNOSIS — F419 Anxiety disorder, unspecified: Principal | ICD-10-CM

## 2013-11-17 DIAGNOSIS — F329 Major depressive disorder, single episode, unspecified: Secondary | ICD-10-CM

## 2013-11-17 DIAGNOSIS — F32A Depression, unspecified: Secondary | ICD-10-CM

## 2013-11-17 DIAGNOSIS — F341 Dysthymic disorder: Secondary | ICD-10-CM

## 2013-11-17 MED ORDER — BUPROPION HCL ER (XL) 300 MG PO TB24: 300.0000 mg | ORAL_TABLET | Freq: Every day | ORAL | Status: DC

## 2013-11-17 NOTE — Progress Notes (Signed)
   Subjective:    Patient ID: Paul Young, male    DOB: 03-17-1975, 38 y.o.   MRN: [id number]  HPI Depression- chronic problem, pt was switched from Lexapro at last visit due to weight gain.  So far, Wellbutrin has not been as effective as Lexapro but is at lower dose.  Has not impacted pt's desire to smoke.  Has not impacted weight gain at all.   Review of Systems For ROS see HPI     Objective:   Physical Exam  Vitals reviewed. Constitutional: He is oriented to person, place, and time. He appears well-developed and well-nourished. No distress.  HENT:  Head: Normocephalic and atraumatic.  Neurological: He is alert and oriented to person, place, and time.  Skin: Skin is warm and dry.  Psychiatric: He has a normal mood and affect. His behavior is normal. Thought content normal.          Assessment & Plan:

## 2013-11-17 NOTE — Progress Notes (Signed)
Pre visit review using our clinic review tool, if applicable. No additional management support is needed unless otherwise documented below in the visit note. 

## 2013-11-17 NOTE — Patient Instructions (Signed)
Follow up as needed Increase Wellbutrin to 300mg  daily- 2 tabs of what you currently have and 1 of the new prescription Try and find a stress outlet- you need to relax! Continue to cut back on smoking- you can do it! Call with any questions or concerns Hang in there!!!

## 2013-11-18 NOTE — Assessment & Plan Note (Signed)
Unchanged.  Pt doesn't feel sxs are as well controlled as they were when he was on Lexapro.  Will increase to 300mg  and monitor for improvement.  Pt expressed understanding and is in agreement w/ plan.

## 2013-12-15 ENCOUNTER — Other Ambulatory Visit: Payer: Self-pay | Admitting: General Practice

## 2013-12-15 MED ORDER — BUPROPION HCL ER (XL) 300 MG PO TB24: 300.0000 mg | ORAL_TABLET | Freq: Every day | ORAL | Status: DC
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
## 2014-01-06 ENCOUNTER — Other Ambulatory Visit: Payer: Self-pay | Admitting: General Practice

## 2014-01-06 MED ORDER — LISINOPRIL 20 MG PO TABS: ORAL_TABLET | ORAL | Status: AC

## 2014-03-17 ENCOUNTER — Encounter: Payer: Self-pay | Admitting: Internal Medicine

## 2014-03-17 ENCOUNTER — Ambulatory Visit (INDEPENDENT_AMBULATORY_CARE_PROVIDER_SITE_OTHER): Payer: Managed Care, Other (non HMO) | Admitting: Internal Medicine

## 2014-03-17 VITALS — BP 142/98 | HR 79 | Temp 97.6°F | Ht 72.0 in | Wt 286.8 lb

## 2014-03-17 DIAGNOSIS — J069 Acute upper respiratory infection, unspecified: Secondary | ICD-10-CM

## 2014-03-17 DIAGNOSIS — J310 Chronic rhinitis: Secondary | ICD-10-CM

## 2014-03-17 MED ORDER — AMOXICILLIN 500 MG PO CAPS: 500.0000 mg | ORAL_CAPSULE | Freq: Three times a day (TID) | ORAL | Status: DC

## 2014-03-17 NOTE — Patient Instructions (Signed)
Plain Mucinex (NOT D) for thick secretions ;force NON dairy fluids .   Nasal cleansing in the shower as discussed with lather of mild shampoo.After 10 seconds wash off lather while  exhaling through nostrils. Make sure that all residual soap is removed to prevent irritation.  Flonase OR Nasacort AQ 1 spray in each nostril twice a day as needed. Use the "crossover" technique into opposite nostril spraying toward opposite ear @ 45 degree angle, not straight up into nostril.  Plain Allegra (NOT D )  160 daily , Loratidine 10 mg , OR Zyrtec 10 mg @ bedtime  as needed for itchy eyes & sneezing.  Fill the  prescription for antibiotic it there is not dramatic improvement in the next 72 hours.

## 2014-03-17 NOTE — Progress Notes (Signed)
Pre visit review using our clinic review tool, if applicable. No additional management support is needed unless otherwise documented below in the visit note. 

## 2014-03-17 NOTE — Progress Notes (Signed)
   Subjective:    Patient ID: Paul Young, male    DOB: [birth year], 39 y.o.   MRN: [id number]  HPI  Symptoms began 03/14/14 as rhinitis and scratchy throat. Symptoms have progressed with marked rhinitis with postnasal drainage. He now has frontal sinus and x-ray sinus discomfort  He's had some itchy, watery eyes and sneezing.  Mucinex is not been of benefit.   He was on antibiotics for dental work last week as he had a tooth pulled.  Review of Systems He denies dental pain, nasal purulence, earache, otic discharge, significant cough, sputum production, wheezing, or shortness of breath  He also denies fever, chills, or sweats    Objective:   Physical Exam   General appearance:good health ;well nourished; no acute distress or increased work of breathing is present.  No  lymphadenopathy about the head, neck, or axilla noted.   Eyes: No conjunctival inflammation or lid edema is present. There is no scleral icterus.  Ears:  External ear exam shows no significant lesions or deformities.  Otoscopic examination reveals clear canals, tympanic membranes are intact bilaterally without bulging, retraction, inflammation or discharge.  Nose:  External nasal examination shows no deformity or inflammation. Nasal mucosa are boggy  without lesions or exudates. No septal dislocation or deviation.No obstruction to airflow.   Oral exam: Dental hygiene is good; lips and gums are healthy appearing.There is no oropharyngeal erythema or exudate noted.   Neck:  No deformities, thyromegaly, masses, or tenderness noted.   Supple with full range of motion without pain.   Heart:  Normal rate and regular rhythm. S1 and S2 normal without gallop, murmur, click, rub or other extra sounds.   Lungs:Chest clear to auscultation; no wheezes, rhonchi,rales ,or rubs present.No increased work of breathing.    Extremities:  No cyanosis, edema, or clubbing  noted    Skin: Warm & dry w/o jaundice or tenting.     Assessment & Plan:  #1 viral URI #2 pharyngitis & rhinitis See orders & AVS

## 2014-03-24 ENCOUNTER — Other Ambulatory Visit: Payer: Self-pay | Admitting: General Practice

## 2014-03-24 MED ORDER — BUPROPION HCL ER (XL) 300 MG PO TB24: 300.0000 mg | ORAL_TABLET | Freq: Every day | ORAL | Status: DC

## 2014-11-25 ENCOUNTER — Ambulatory Visit (INDEPENDENT_AMBULATORY_CARE_PROVIDER_SITE_OTHER): Payer: Managed Care, Other (non HMO) | Admitting: Internal Medicine

## 2014-11-25 ENCOUNTER — Encounter: Payer: Self-pay | Admitting: Internal Medicine

## 2014-11-25 VITALS — BP 132/78 | HR 78 | Temp 97.8°F | Resp 16 | Ht 72.0 in | Wt 280.2 lb

## 2014-11-25 DIAGNOSIS — J069 Acute upper respiratory infection, unspecified: Secondary | ICD-10-CM | POA: Diagnosis not present

## 2014-11-25 MED ORDER — AMOXICILLIN 500 MG PO CAPS: 1000.0000 mg | ORAL_CAPSULE | Freq: Two times a day (BID) | ORAL | Status: AC

## 2014-11-25 MED ORDER — AZELASTINE HCL 0.1 % NA SOLN: 2.0000 | Freq: Every evening | NASAL | Status: AC | PRN

## 2014-11-25 NOTE — Progress Notes (Signed)
Pre visit review using our clinic review tool, if applicable. No additional management support is needed unless otherwise documented below in the visit note. 

## 2014-11-25 NOTE — Patient Instructions (Signed)
Rest, fluids , tylenol  For cough: Take Mucinex DM twice a day as needed until better  For nasal congestion ASTELIN : 2 nasal sprays on each side of the nose twice a day until you feel better  Take the antibiotic as prescribed  (Amoxicillin) only if not improving in the next few days.  Call if not gradually better over the next  10 days  Call anytime if the symptoms are severe

## 2014-11-25 NOTE — Progress Notes (Signed)
Subjective:    Patient ID: Paul Young, male    DOB: [birth year], 39 y.o.   MRN: [id number]  DOS:  11/25/2014 Type of visit - description : Acute visit Interval history: Symptoms started 4 days ago with nose congestion, sinus pain, some sore throat and cough. Today, he woke up wit a deep sinus headache, it was intense but this afternoon is much improved with Tylenol. His daughter has fever and respiratory symptoms    Review of Systems  Denies fever chills on himself. No nausea vomiting Mild chest congestion, occasionally produces yellow sputum.  Past Medical History  Diagnosis Date  . Hypertension   . Obesity (BMI 30-39.9)     Past Surgical History  Procedure Laterality Date  . Hernia repair      age 80    Social History   Social History  . Marital Status: Single    Spouse Name: N/A  . Number of Children: N/A  . Years of Education: N/A   Occupational History  . Not on file.   Social History Main Topics  . Smoking status: Current Every Day Smoker -- 1.00 packs/day    Types: Cigarettes  . Smokeless tobacco: Not on file  . Alcohol Use: Yes     Comment: weekly  . Drug Use: Yes    Special: [place]  . Sexual Activity: Not on file   Other Topics Concern  . Not on file   Social History Narrative        Medication List       This list is accurate as of: 11/25/14 11:59 PM.  Always use your most recent med list.               amoxicillin 500 MG capsule  Commonly known as:  AMOXIL  Take 2 capsules (1,000 mg total) by mouth 2 (two) times daily.     azelastine 0.1 % nasal spray  Commonly known as:  ASTELIN  Place 2 sprays into both nostrils at bedtime as needed for rhinitis. Use in each nostril as directed     lisinopril 20 MG tablet  Commonly known as:  PRINIVIL,ZESTRIL  TAKE 1 TABLET BY MOUTH EVERY DAY           Objective:   Physical Exam BP 132/78 mmHg  Pulse 78  Temp(Src) 97.8 F (36.6 C) (Oral)  Resp 16  Ht 6' (1.829 m)  Wt 280  lb 4 oz (127.121 kg)  BMI 38.00 kg/m2 General:   Well developed, well nourished . NAD.  HEENT:  Normocephalic . Face symmetric, atraumatic. TMs normal. Nose quite congested, throat not red. Sinuses no TTP Lungs:  CTA B Normal respiratory effort, no intercostal retractions, no accessory muscle use. Heart: RRR,  no murmur.  No pretibial edema bilaterally  Skin: Not pale. Not jaundice Neurologic:  alert & oriented X3.  Speech normal, gait appropriate for age and unassisted Psych--  Cognition and judgment appear intact.  Cooperative with normal attention span and concentration.  Behavior appropriate. No anxious or depressed appearing.      Assessment & Plan:    URI Symptoms consistent with URI, he developed a deep sinus pain behind the nose this morning, early sinusitis?. Plan: Conservative treatment, amoxicillin if not better. He is taking Mucinex D, recommend to switch to Mucinex DM.  Also, before he left the office he reported bilateral heel pain at the plantar aspect on and off for a while. Plantar fasciitis? Recommend appropriate stretching twice a day, if not  better needs to see PCP  Hypertension, depression: Currently not taking medication, recommend to call us when ready to restart medicines. Had some problems with his insurance
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
## 2015-04-01 ENCOUNTER — Telehealth: Payer: Self-pay | Admitting: Family Medicine

## 2015-04-01 NOTE — Telephone Encounter (Signed)
LM to schedule flu shot and CPE (last saw Dr. Birdie Riddle 10/2013)
